# Patient Record
Sex: Male | Born: 1980
Health system: Southern US, Community
[De-identification: ages and names within clinical notes are randomized; demographics above are authoritative.]

## PROBLEM LIST (undated history)

## (undated) DIAGNOSIS — Z8781 Personal history of (healed) traumatic fracture: Secondary | ICD-10-CM

## (undated) HISTORY — DX: Personal history of (healed) traumatic fracture: Z87.81

---

## 2005-05-20 ENCOUNTER — Inpatient Hospital Stay (HOSPITAL_COMMUNITY): Admission: EM | Admit: 2005-05-20 | Discharge: 2005-05-22 | Payer: Self-pay | Admitting: Emergency Medicine

## 2005-05-24 HISTORY — PX: FRACTURE SURGERY: SHX138

## 2005-09-26 ENCOUNTER — Encounter: Admission: RE | Admit: 2005-09-26 | Discharge: 2005-09-26 | Payer: Self-pay | Admitting: Orthopedic Surgery

## 2005-09-29 ENCOUNTER — Ambulatory Visit (HOSPITAL_COMMUNITY): Admission: RE | Admit: 2005-09-29 | Discharge: 2005-09-30 | Payer: Self-pay | Admitting: Orthopedic Surgery

## 2006-12-01 IMAGING — CR DG TIBIA/FIBULA PORT 2V*L*
2 series · 2 of 2 positions shown · non-contrast
Comparison: 09/26/05.

CLINICAL DATA: Nonunion of left tibial fracture ? status post bone graft. 
 PORTABLE LEFT TIBIA/FIBULA ? 2 VIEW:

[view not recorded (1 of 2)]
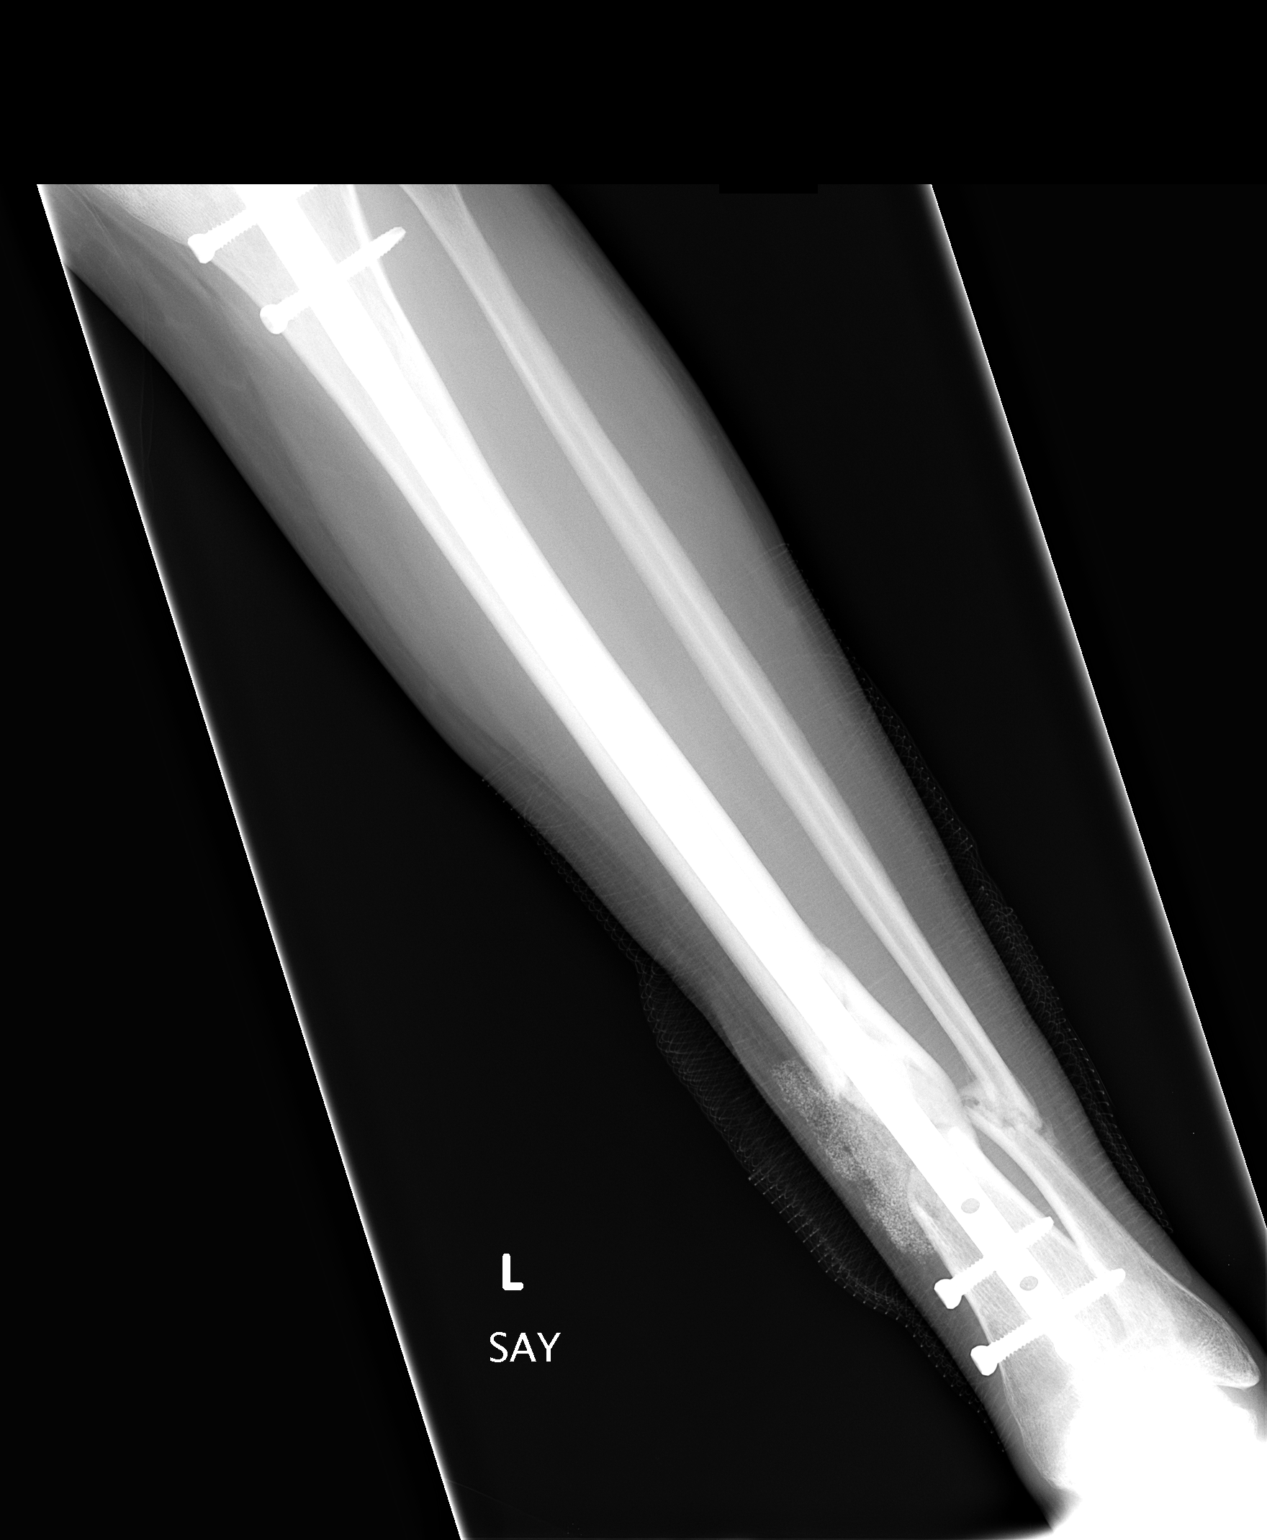

[view not recorded (2 of 2)]
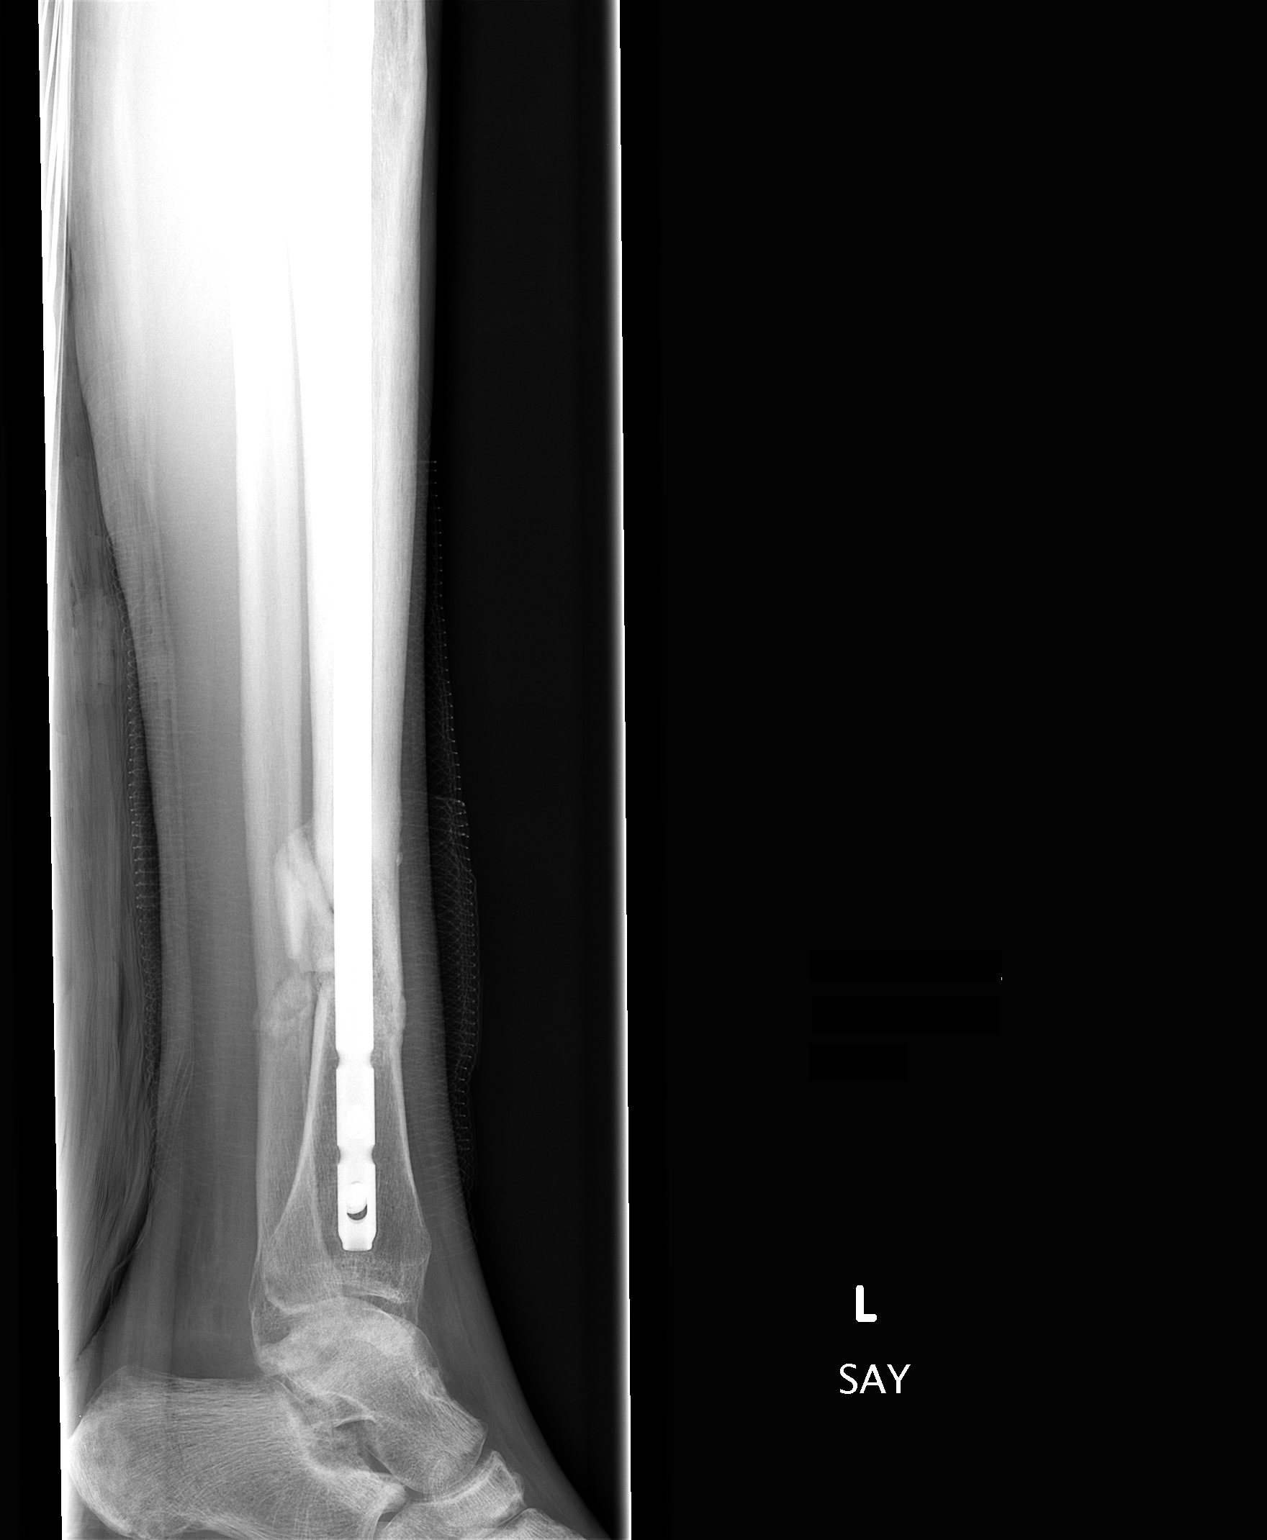

[2 of 2 positions shown; findings below may reference images not displayed]

FINDINGS: Two views with portable apparatus show placement of radiodense bone-like material along the medial aspect of the ununited tibial fracture.
IMPRESSION: Status post bone packing/grafting.

## 2011-06-07 ENCOUNTER — Ambulatory Visit (INDEPENDENT_AMBULATORY_CARE_PROVIDER_SITE_OTHER): Payer: Worker's Compensation | Admitting: Family Medicine

## 2011-06-07 ENCOUNTER — Encounter: Payer: Self-pay | Admitting: Family Medicine

## 2011-06-07 DIAGNOSIS — M75100 Unspecified rotator cuff tear or rupture of unspecified shoulder, not specified as traumatic: Secondary | ICD-10-CM

## 2011-06-07 DIAGNOSIS — M7520 Bicipital tendinitis, unspecified shoulder: Secondary | ICD-10-CM

## 2011-06-07 DIAGNOSIS — M7550 Bursitis of unspecified shoulder: Secondary | ICD-10-CM

## 2011-06-07 DIAGNOSIS — M67929 Unspecified disorder of synovium and tendon, unspecified upper arm: Secondary | ICD-10-CM

## 2011-06-07 NOTE — Progress Notes (Signed)
Dr. Kathrine Haddock has requested a consult for Right shoulder pain:  The patient noted above presents with shoulder pain that has been ongoing since DOI 05/24/2011. The patient describes grabbing a gear bag at work and flinging behind his R shoulder with acute onset of pain in the anterior shoulder and arm. He had some tingling in his arm for a few days, which has subsided. The patient denies neck pain. Denies dislocation, subluxation, separation of the shoulder. The patient does complain of pain in the overhead plane with mild painful arc of motion - worst with "reaching for back pocket" motion. Now on light duty with restriction of no overhead lifting.   He feels like he is improving.  Medications Tried: Tylenol, motrin Ice or Heat: minimally helpful Tried PT: No  Prior shoulder Injury: No Prior surgery: No Prior fracture: No  Patient Active Problem List  Diagnoses  . Subacromial bursitis  . Biceps tendinopathy   Past Medical History  Diagnosis Date  . History of fracture of lower leg    No past surgical history on file. History  Substance Use Topics  . Smoking status: Former Games developer  . Smokeless tobacco: Never Used  . Alcohol Use: Not on file   No family history on file. No Known Allergies No current outpatient prescriptions on file prior to visit.    REVIEW OF SYSTEMS  GEN: No fevers, chills. Nontoxic. Primarily MSK c/o today. MSK: Detailed in the HPI GI: tolerating PO intake without difficulty Neuro: above Otherwise, the pertinent positives and negatives are listed above and in the HPI, otherwise a full review of systems has been reviewed and is negative unless noted positive.    PHYSICAL EXAM  Blood pressure 146/92, pulse 88, height 6' (1.829 m), weight 192 lb (87.091 kg).  GEN: Well-developed,well-nourished,in no acute distress; alert,appropriate and cooperative throughout examination HEENT: Normocephalic and atraumatic without obvious abnormalities. Ears,  externally no deformities PULM: Breathing comfortably in no respiratory distress EXT: No clubbing, cyanosis, or edema PSYCH: Normally interactive. Cooperative during the interview. Pleasant. Friendly and conversant. Not anxious or depressed appearing. Normal, full affect.  Shoulder: R Inspection: No muscle wasting or winging Ecchymosis/edema: neg  AC joint, scapula, clavicle: mild TTP Cervical spine: NT, full ROM Spurling's: neg Abduction: full, 5/5 Flexion: full, 5/5 IR, full, lift-off: 5/5 ER at neutral: full, 5/5 AC crossover: pos Neer: neg Hawkins: mild Drop Test: neg Empty Can: neg Supraspinatus insertion: mild-mod T Bicipital groove: TTP Speed's: POS Yergason's: POS Sulcus sign: neg Scapular dyskinesis: none C5-T1 intact  Neuro: Sensation intact Grip 5/5   Diagnostic Ultrasound Evaluation [employer] E, MSK ultrasound, MSK probe Anatomy scanned: right shoulder Indication: Pain Findings: There is significant fluid surrounding the biceps tendon in the sheath. The biceps tendon has areas that appear hypoechoic in part and less congruous compared to typical. This may represent horizontal tearing or partial thickness tear. No tearing on subscap or supraspinatus. No subcoracoid impingement. Mild subacromial bursitis. No significant AC arthritis.   A/P: 1. Probable horizontal tear of biceps tendon with associated increased fluid in sheath: Improving clinically. 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  Biceps lowers, eccentrics Rotator cuff strengthening and scapular stabilization exercises were reviewed with the patient.  [facility] RTC and scapular stabilization program given to the patient. Retraining shoulder mechanics and function was emphasized to the patient with rehab done at least 5-6 days a week.  Recommend:  formal PT to assist with Biceps functional rehab, scapular stabilization and RTC strengthening.  2-3 times a week for 2-3  weeks  Recheck in 3 weeks  2. Subacromial bursitis: corresponds to area of pain on exam, correlates with US findings.  Cc: Dr. Kathrine Haddock

## 2011-06-08 NOTE — Progress Notes (Signed)
This visit note faxed to Grayland Ormond Hunt's office for referral to PT for patient.  Faxed to [phone].

## 2011-06-28 ENCOUNTER — Encounter: Payer: Self-pay | Admitting: Family Medicine

## 2011-06-28 ENCOUNTER — Ambulatory Visit (INDEPENDENT_AMBULATORY_CARE_PROVIDER_SITE_OTHER): Payer: Worker's Compensation | Admitting: Family Medicine

## 2011-06-28 DIAGNOSIS — M75100 Unspecified rotator cuff tear or rupture of unspecified shoulder, not specified as traumatic: Secondary | ICD-10-CM

## 2011-06-28 DIAGNOSIS — M7520 Bicipital tendinitis, unspecified shoulder: Secondary | ICD-10-CM

## 2011-06-28 DIAGNOSIS — M7550 Bursitis of unspecified shoulder: Secondary | ICD-10-CM

## 2011-06-28 DIAGNOSIS — M67929 Unspecified disorder of synovium and tendon, unspecified upper arm: Secondary | ICD-10-CM

## 2011-06-29 NOTE — Progress Notes (Signed)
Peter Fuller, a 30 y.o. male presents today in the office for the following:    F/u R shoulder pain, sub ac bursitis, biceps tendinopathy.  Has been very compliant with RTC str and scapular stabilization, has been on limitations at work, and is now about 90-95% better. No functional limitation  Prior OV: The patient noted above presents with shoulder pain that has been ongoing since DOI 05/24/2011. The patient describes grabbing a gear bag at work and flinging behind his R shoulder with acute onset of pain in the anterior shoulder and arm. He had some tingling in his arm for a few days, which has subsided. The patient denies neck pain. Denies dislocation, subluxation, separation of the shoulder. The patient does complain of pain in the overhead plane with mild painful arc of motion - worst with "reaching for back pocket" motion. Now on light duty with restriction of no overhead lifting.   He feels like he is improving.  Medications Tried: Tylenol, motrin Ice or Heat: minimally helpful Tried PT: No  Prior shoulder Injury: No Prior surgery: No Prior fracture: No  Patient Active Problem List  Diagnoses  . Subacromial bursitis  . Biceps tendinopathy   Past Medical History  Diagnosis Date  . History of fracture of lower leg    No past surgical history on file. History  Substance Use Topics  . Smoking status: Former Games developer  . Smokeless tobacco: Never Used  . Alcohol Use: Not on file   No family history on file. No Known Allergies No current outpatient prescriptions on file prior to visit.    REVIEW OF SYSTEMS  GEN: No fevers, chills. Nontoxic. Primarily MSK c/o today. MSK: Detailed in the HPI GI: tolerating PO intake without difficulty Neuro: above Otherwise, the pertinent positives and negatives are listed above and in the HPI, otherwise a full review of systems has been reviewed and is negative unless noted positive.    PHYSICAL EXAM  Blood pressure 135/89, pulse  66.  GEN: Well-developed,well-nourished,in no acute distress; alert,appropriate and cooperative throughout examination HEENT: Normocephalic and atraumatic without obvious abnormalities. Ears, externally no deformities PULM: Breathing comfortably in no respiratory distress EXT: No clubbing, cyanosis, or edema PSYCH: Normally interactive. Cooperative during the interview. Pleasant. Friendly and conversant. Not anxious or depressed appearing. Normal, full affect.  Shoulder: R Inspection: No muscle wasting or winging Ecchymosis/edema: neg  AC joint, scapula, clavicle: NT Cervical spine: NT, full ROM Spurling's: neg Abduction: full, 5/5 Flexion: full, 5/5 IR, full, lift-off: 5/5 ER at neutral: full, 5/5 AC crossover: NEG Neer: neg Hawkins: mild Drop Test: neg Empty Can: neg Supraspinatus insertion: NT Bicipital groove: NEG Speed's: NEG Yergason's: NEG Sulcus sign: neg Scapular dyskinesis: none C5-T1 intact  Neuro: Sensation intact Grip 5/5   1. Subacromial bursitis   2. Biceps tendinopathy     Plan: Dramatically improved. Clear to full duty, cont with maintenance HEP for bicep, RTC, scapula.  Cc: Dr. Kathrine Haddock

## 2014-01-28 ENCOUNTER — Ambulatory Visit: Payer: Worker's Compensation | Admitting: Family Medicine

## 2014-02-07 ENCOUNTER — Ambulatory Visit: Payer: Worker's Compensation | Admitting: Family Medicine

## 2014-02-21 ENCOUNTER — Encounter: Payer: Self-pay | Admitting: Family Medicine

## 2014-02-21 ENCOUNTER — Ambulatory Visit (INDEPENDENT_AMBULATORY_CARE_PROVIDER_SITE_OTHER): Payer: 59 | Admitting: Family Medicine

## 2014-02-21 VITALS — BP 130/80 | HR 67 | Temp 97.9°F | Ht 71.75 in | Wt 190.2 lb

## 2014-02-21 DIAGNOSIS — R5381 Other malaise: Secondary | ICD-10-CM | POA: Insufficient documentation

## 2014-02-21 DIAGNOSIS — N529 Male erectile dysfunction, unspecified: Secondary | ICD-10-CM

## 2014-02-21 DIAGNOSIS — R5383 Other fatigue: Principal | ICD-10-CM

## 2014-02-21 DIAGNOSIS — R6882 Decreased libido: Secondary | ICD-10-CM

## 2014-02-21 DIAGNOSIS — B009 Herpesviral infection, unspecified: Secondary | ICD-10-CM

## 2014-02-21 DIAGNOSIS — F520 Hypoactive sexual desire disorder: Secondary | ICD-10-CM

## 2014-02-21 MED ORDER — VALACYCLOVIR HCL 1 G PO TABS: 1000.0000 mg | ORAL_TABLET | Freq: Two times a day (BID) | ORAL | Status: DC

## 2014-02-21 NOTE — Progress Notes (Signed)
Subjective:    Patient ID: Peter Fuller, male    DOB: [birth year], 33 y.o.   MRN: [id number]  HPI 33 year old male presents to establish. Previously seen [facility]. Clovis RileyMitchell at [facility]. Last CPX last year. 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  He reports he has been having a lot of fatigue , ongoing for 2 years, gradually worsening. Has been shown to have low testosterone twice in past, in 2013. He has had decrease sexual functioning and now ED. Going to marriage counseling. Some issues with restlessness at night, no insomnia  He does snore occ, no apnea per wife. No depression, anxiety.  He has HSV 1 orally... Needs refill of valacyclovir.   He has been told PFT decreasing some in past. No SOB.  Exercsies  Diet:     Review of Systems  Constitutional: Positive for fatigue. Negative for fever and unexpected weight change.  HENT: Negative for congestion, ear pain, postnasal drip, rhinorrhea, sore throat and trouble swallowing.   Eyes: Negative for pain.  Respiratory: Negative for cough, shortness of breath and wheezing.   Cardiovascular: Negative for chest pain, palpitations and leg swelling.  Gastrointestinal: Negative for nausea, abdominal pain, diarrhea, constipation and blood in stool.  Genitourinary: Negative for dysuria, urgency, hematuria, discharge, penile swelling, scrotal swelling, difficulty urinating, penile pain and testicular pain.  Skin: Negative for rash.  Neurological: Negative for syncope, weakness, light-headedness, numbness and headaches.  Psychiatric/Behavioral: Negative for suicidal ideas, behavioral problems and dysphoric mood. The patient is not nervous/anxious.        Objective:   Physical Exam  Constitutional: He appears well-developed and well-nourished.  Non-toxic appearance. He does not appear ill. No distress.  HENT:  Head: Normocephalic and atraumatic.  Right Ear: Hearing, tympanic membrane, external ear and ear canal  normal.  Left Ear: Hearing, tympanic membrane, external ear and ear canal normal.  Nose: Nose normal.  Mouth/Throat: Uvula is midline, oropharynx is clear and moist and mucous membranes are normal.  Eyes: Conjunctivae, EOM and lids are normal. Pupils are equal, round, and reactive to light. Lids are everted and swept, no foreign bodies found.  Neck: Trachea normal, normal range of motion and phonation normal. Neck supple. Carotid bruit is not present. No mass and no thyromegaly present.  Cardiovascular: Normal rate, regular rhythm, S1 normal, S2 normal, intact distal pulses and normal pulses.  Exam reveals no gallop.   No murmur heard. Pulmonary/Chest: Breath sounds normal. He has no wheezes. He has no rhonchi. He has no rales.  Abdominal: Soft. Normal appearance and bowel sounds are normal. There is no hepatosplenomegaly. There is no tenderness. There is no rebound, no guarding and no CVA tenderness. No hernia. Hernia confirmed negative in the right inguinal area and confirmed negative in the left inguinal area.  Genitourinary: Testes normal and penis normal. Right testis shows no mass and no tenderness. Left testis shows no mass and no tenderness. No paraphimosis or penile tenderness.  Normal size testicles  Lymphadenopathy:    He has no cervical adenopathy.       Right: No inguinal adenopathy present.       Left: No inguinal adenopathy present.  Neurological: He is alert. He has normal strength and normal reflexes. No cranial nerve deficit or sensory deficit. Gait normal.  Skin: Skin is warm, dry and intact. No rash noted.  Psychiatric: He has a normal mood and affect. His speech is normal and behavior is normal. Judgment normal.  Assessment & Plan:

## 2014-02-21 NOTE — Addendum Note (Signed)
Addended by: Alvina ChouWALSH, TERRI J on: 02/21/2014 12:42 PM   Modules accepted: Orders

## 2014-02-21 NOTE — Patient Instructions (Addendum)
Return for early morning no fasting labs to evaluate fatigue. We will call you with the results.

## 2014-02-21 NOTE — Progress Notes (Signed)
Pre visit review using our clinic review tool, if applicable. No additional management support is needed unless otherwise documented below in the visit note. 

## 2014-02-24 ENCOUNTER — Other Ambulatory Visit (INDEPENDENT_AMBULATORY_CARE_PROVIDER_SITE_OTHER): Payer: Self-pay

## 2014-02-24 DIAGNOSIS — B009 Herpesviral infection, unspecified: Secondary | ICD-10-CM

## 2014-02-24 DIAGNOSIS — R6882 Decreased libido: Secondary | ICD-10-CM

## 2014-02-24 DIAGNOSIS — F520 Hypoactive sexual desire disorder: Secondary | ICD-10-CM

## 2014-02-24 DIAGNOSIS — R5383 Other fatigue: Principal | ICD-10-CM

## 2014-02-24 DIAGNOSIS — N529 Male erectile dysfunction, unspecified: Secondary | ICD-10-CM

## 2014-02-24 DIAGNOSIS — R5381 Other malaise: Secondary | ICD-10-CM

## 2014-02-24 LAB — CBC WITH DIFFERENTIAL/PLATELET
Basophils Absolute: 0 10*3/uL (ref 0.0–0.1)
Basophils Relative: 0.4 % (ref 0.0–3.0)
Eosinophils Absolute: 0.1 10*3/uL (ref 0.0–0.7)
Eosinophils Relative: 1.4 % (ref 0.0–5.0)
HCT: 44.7 % (ref 39.0–52.0)
Hemoglobin: 15 g/dL (ref 13.0–17.0)
Lymphocytes Relative: 27 % (ref 12.0–46.0)
Lymphs Abs: 1.7 10*3/uL (ref 0.7–4.0)
MCHC: 33.5 g/dL (ref 30.0–36.0)
MCV: 89.9 fl (ref 78.0–100.0)
Monocytes Absolute: 0.5 10*3/uL (ref 0.1–1.0)
Monocytes Relative: 8.8 % (ref 3.0–12.0)
Neutro Abs: 3.9 10*3/uL (ref 1.4–7.7)
Neutrophils Relative %: 62.4 % (ref 43.0–77.0)
Platelets: 229 10*3/uL (ref 150.0–400.0)
RBC: 4.98 Mil/uL (ref 4.22–5.81)
RDW: 13.7 % (ref 11.5–14.6)
WBC: 6.2 10*3/uL (ref 4.5–10.5)

## 2014-02-24 LAB — TESTOSTERONE: Testosterone: 307.18 ng/dL — ABNORMAL LOW (ref 350.00–890.00)

## 2014-02-24 LAB — TSH: TSH: 2.57 u[IU]/mL (ref 0.35–5.50)

## 2014-02-24 LAB — VITAMIN B12: Vitamin B-12: 340 pg/mL (ref 211–911)

## 2014-02-25 ENCOUNTER — Telehealth: Payer: Self-pay | Admitting: Family Medicine

## 2014-02-25 DIAGNOSIS — F520 Hypoactive sexual desire disorder: Secondary | ICD-10-CM

## 2014-02-25 DIAGNOSIS — R7989 Other specified abnormal findings of blood chemistry: Secondary | ICD-10-CM | POA: Insufficient documentation

## 2014-02-25 DIAGNOSIS — N529 Male erectile dysfunction, unspecified: Secondary | ICD-10-CM

## 2014-02-25 LAB — VITAMIN D 25 HYDROXY (VIT D DEFICIENCY, FRACTURES): Vit D, 25-Hydroxy: 31 ng/mL (ref 30–89)

## 2014-02-25 NOTE — Telephone Encounter (Signed)
Message copied by Excell SeltzerBEDSOLE, Jencarlos Nicolson E on Tue Feb 25, 2014  4:51 PM ------      Message from: Damita LackLORING, DONNA S      Created: Tue Feb 25, 2014  4:27 PM       Patient notified as instructed by telephone.  He would like to move forward with Urology Referral. ------

## 2014-03-05 ENCOUNTER — Encounter: Payer: Self-pay | Admitting: Family Medicine

## 2014-05-20 ENCOUNTER — Telehealth: Payer: Self-pay | Admitting: Family Medicine

## 2014-05-20 NOTE — Telephone Encounter (Signed)
Patient Information:  Caller Name: [place]  Phone: [phone]  Patient: W[place], [place] I  Gender: Male  DOB: May 01, 1981  Age: 33 Years  PCP: Kerby Nora (Family Practice)  Office Follow Up:  Does the office need to follow up with this patient?: Yes  Instructions For The Office: PLEASE REVIEW AND ADVISE  RN Note:  Pharmacy [facility] [phone].  Husband wanting to know should he be given Treatment for +culture results from wife. PLEASE REVIEW.  Symptoms  Reason For Call & Symptoms: Patient states that his wife has tested postive for  E.Coli. She was placed on Cipro.  She was seen by her OB/GYN for cyst on inner labia. The OB/GYN  ruputured the cyst and did a culture an it came back E.Coli.  Husband is concerned and should he be treated since they are sexually active.?  He is not experiencing any drainage , sores or infection.  Reviewed Health History In EMR: Yes  Reviewed Medications In EMR: Yes  Reviewed Allergies In EMR: Yes  Reviewed Surgeries / Procedures: Yes  Date of Onset of Symptoms: 05/20/2014  Guideline(s) Used:  No Protocol Available - Information Only  Disposition Per Guideline:   Discuss with PCP and Callback by Nurse Today  Reason For Disposition Reached:   Nursing judgment  Advice Given:  Call Back If:  New symptoms develop  You become worse.  Patient Will Follow Care Advice:  YES

## 2014-05-20 NOTE — Telephone Encounter (Signed)
[place] notified as instructed by telephone.

## 2014-05-20 NOTE — Telephone Encounter (Signed)
No, this is not an STD.

## 2014-05-30 ENCOUNTER — Other Ambulatory Visit: Payer: Self-pay | Admitting: *Deleted

## 2014-05-30 MED ORDER — VALACYCLOVIR HCL 1 G PO TABS: 1000.0000 mg | ORAL_TABLET | Freq: Two times a day (BID) | ORAL | Status: DC

## 2014-06-04 ENCOUNTER — Other Ambulatory Visit: Payer: Self-pay | Admitting: *Deleted

## 2014-06-04 MED ORDER — VALACYCLOVIR HCL 1 G PO TABS: 1000.0000 mg | ORAL_TABLET | Freq: Two times a day (BID) | ORAL | Status: DC

## 2014-06-04 NOTE — Telephone Encounter (Signed)
Received fax from [facility] that states per patient's request can we please have a new Rx with a larger quantity.   Refilled on 05/30/2014 for #6 tablets with 3 refills.  Please advise.

## 2014-09-26 ENCOUNTER — Other Ambulatory Visit: Payer: Self-pay

## 2015-01-19 ENCOUNTER — Other Ambulatory Visit: Payer: Self-pay | Admitting: Occupational Medicine

## 2015-01-19 ENCOUNTER — Ambulatory Visit: Payer: Self-pay

## 2015-01-19 DIAGNOSIS — R52 Pain, unspecified: Secondary | ICD-10-CM

## 2015-08-10 ENCOUNTER — Other Ambulatory Visit: Payer: Self-pay | Admitting: Family Medicine

## 2015-08-10 NOTE — Telephone Encounter (Signed)
Last office visit 02/21/2014.  Refill?

## 2015-08-10 NOTE — Telephone Encounter (Signed)
Overdue for CPX.. Call to schedule., Refill denied until appt set.

## 2015-08-11 NOTE — Telephone Encounter (Signed)
Left message asking pt to call office  °

## 2015-08-11 NOTE — Telephone Encounter (Signed)
Please call and schedule CPE with fasting labs prior for Dr. Bedsole.  

## 2015-08-17 ENCOUNTER — Telehealth: Payer: Self-pay | Admitting: Family Medicine

## 2015-08-17 DIAGNOSIS — R7989 Other specified abnormal findings of blood chemistry: Secondary | ICD-10-CM

## 2015-08-17 DIAGNOSIS — Z13220 Encounter for screening for lipoid disorders: Secondary | ICD-10-CM

## 2015-08-17 NOTE — Telephone Encounter (Signed)
-----   Message from Alvina Chou sent at 08/11/2015  2:58 PM EDT ----- Regarding: Lab orders for Tuesday, 9.6.16 Patient is scheduled for CPX labs, please order future labs, Thanks , Camelia Eng

## 2015-08-18 ENCOUNTER — Encounter: Payer: Self-pay | Admitting: Internal Medicine

## 2015-08-18 ENCOUNTER — Other Ambulatory Visit: Payer: Self-pay | Admitting: Internal Medicine

## 2015-08-18 ENCOUNTER — Ambulatory Visit (INDEPENDENT_AMBULATORY_CARE_PROVIDER_SITE_OTHER): Payer: 59 | Admitting: Internal Medicine

## 2015-08-18 ENCOUNTER — Other Ambulatory Visit (INDEPENDENT_AMBULATORY_CARE_PROVIDER_SITE_OTHER): Payer: 59

## 2015-08-18 VITALS — BP 128/86 | HR 71 | Temp 97.9°F | Wt 197.0 lb

## 2015-08-18 DIAGNOSIS — M109 Gout, unspecified: Secondary | ICD-10-CM

## 2015-08-18 DIAGNOSIS — E291 Testicular hypofunction: Secondary | ICD-10-CM | POA: Diagnosis not present

## 2015-08-18 DIAGNOSIS — M1000 Idiopathic gout, unspecified site: Secondary | ICD-10-CM | POA: Diagnosis not present

## 2015-08-18 DIAGNOSIS — Z13220 Encounter for screening for lipoid disorders: Secondary | ICD-10-CM

## 2015-08-18 DIAGNOSIS — R7989 Other specified abnormal findings of blood chemistry: Secondary | ICD-10-CM

## 2015-08-18 LAB — COMPREHENSIVE METABOLIC PANEL WITH GFR
ALT: 14 U/L (ref 0–53)
AST: 16 U/L (ref 0–37)
Albumin: 4.5 g/dL (ref 3.5–5.2)
Alkaline Phosphatase: 45 U/L (ref 39–117)
BUN: 11 mg/dL (ref 6–23)
CO2: 32 meq/L (ref 19–32)
Calcium: 9.3 mg/dL (ref 8.4–10.5)
Chloride: 103 meq/L (ref 96–112)
Creatinine, Ser: 0.94 mg/dL (ref 0.40–1.50)
GFR: 96.39 mL/min
Glucose, Bld: 91 mg/dL (ref 70–99)
Potassium: 4 meq/L (ref 3.5–5.1)
Sodium: 143 meq/L (ref 135–145)
Total Bilirubin: 0.6 mg/dL (ref 0.2–1.2)
Total Protein: 6.6 g/dL (ref 6.0–8.3)

## 2015-08-18 LAB — LIPID PANEL
Cholesterol: 165 mg/dL (ref 0–200)
HDL: 41.3 mg/dL (ref >39.00)
LDL Cholesterol: 94 mg/dL (ref 0–99)
NonHDL: 123.34
Total CHOL/HDL Ratio: 4
Triglycerides: 146 mg/dL (ref 0.0–149.0)
VLDL: 29.2 mg/dL (ref 0.0–40.0)

## 2015-08-18 LAB — URIC ACID: URIC ACID, SERUM: 6.5 mg/dL (ref 4.0–7.8)

## 2015-08-18 LAB — TESTOSTERONE: Testosterone: 647.5 ng/dL (ref 300.00–890.00)

## 2015-08-18 MED ORDER — INDOMETHACIN 50 MG PO CAPS: 50.0000 mg | ORAL_CAPSULE | Freq: Three times a day (TID) | ORAL | Status: DC | PRN

## 2015-08-18 NOTE — Progress Notes (Signed)
Subjective:    Patient ID: Peter Fuller, male    DOB: Nov 15, 1981, 34 y.o.   MRN: [id number]  HPI  Pt presents to the clinic today with c/o toe pain. He reports this started 2-3 days ago. The toe has been red, swollen and painful. He describes the pain as throbbing and burning. It was so bad, that he could not put a sock or shoe on it yesterday. He has been taking Ibuprofen with minimal relief. He has never had symptoms like this before. He denies any injury to the area. He has no personal or family history of gout. He has not eaten a lot of red meat, drank any alcohol or consumed high purine foods that he is aware of.  Review of Systems      Past Medical History  Diagnosis Date  . History of fracture of lower leg     Current Outpatient Prescriptions  Medication Sig Dispense Refill  . valACYclovir (VALTREX) 1000 MG tablet Take 1 tablet (1,000 mg total) by mouth 2 (two) times daily. 30 tablet 3   No current facility-administered medications for this visit.    No Known Allergies  Family History  Problem Relation Age of Onset  . Arthritis Mother   . Hyperlipidemia Mother   . Hypertension Mother   . Hyperlipidemia Father   . Hypertension Father   . Arthritis Maternal Grandmother   . Colon cancer Paternal Grandmother   . Arthritis Paternal Grandmother     Social History   Social History  . Marital Status: Single    Spouse Name: N/A  . Number of Children: N/A  . Years of Education: N/A   Occupational History  . Not on file.   Social History Main Topics  . Smoking status: Former Smoker    Quit date: 08/26/2004  . Smokeless tobacco: Never Used  . Alcohol Use: 0.0 - 2.5 oz/week    0-5 drink(s) per week     Comment: 0-5 drinks/week  . Drug Use: No  . Sexual Activity: Yes   Other Topics Concern  . Not on file   Social History Narrative   Exercise: 20-30 min 3-4 times a week.   Diet: healthy    Married, no children     Constitutional: Denies fever, malaise,  fatigue, headache or abrupt weight changes.  Respiratory: Denies difficulty breathing, shortness of breath, cough or sputum production.   Cardiovascular: Denies chest pain, chest tightness, palpitations or swelling in the hands or feet.  Musculoskeletal: Pt reports left toe pain and swelling. Denies decrease in range of motion, difficulty with gait, muscle pain.  Skin: Pt reports redness of left big toe. Denies rashes, lesions or ulcercations.   No other specific complaints in a complete review of systems (except as listed in HPI above).  Objective:   Physical Exam  BP 128/86 mmHg  Pulse 71  Temp(Src) 97.9 F (36.6 C) (Oral)  Wt 197 lb (89.359 kg)  SpO2 99% Wt Readings from Last 3 Encounters:  08/18/15 197 lb (89.359 kg)  02/21/14 190 lb 4 oz (86.297 kg)  06/07/11 192 lb (87.091 kg)    General: Appears his stated age, well developed, well nourished in NAD. Skin: Warm, dry and intact. Left big toe not red today. Cardiovascular: Normal rate and rhythm. S1,S2 noted.  No murmur, rubs or gallops noted.  Pulmonary/Chest: Normal effort and positive vesicular breath sounds. No respiratory distress. No wheezes, rales or ronchi noted.  Musculoskeletal: Normal flexion, extension of the left big toe.  Pain with palpation along the medial edge of the left great toe. No swelling noted today. No difficulty with gait.  Neurological: Alert and oriented. Sensation intact to BLE.    Assessment & Plan:   Possible gout of left great toe:  Advised him to stop Ibuprofen eRx for Indomethacin 50 mg TID prn for pain (no CMET to review but he reports he has no history of kidney disease) Diet information given for gout He will return in 2 weeks to check uric acid levels  RTC as needed or if symptoms persist or worsen

## 2015-08-18 NOTE — Patient Instructions (Signed)

## 2015-08-18 NOTE — Progress Notes (Signed)
Pre visit review using our clinic review tool, if applicable. No additional management support is needed unless otherwise documented below in the visit note. 

## 2015-08-18 NOTE — Addendum Note (Signed)
Addended by: Baldomero Lamy on: 08/18/2015 09:50 AM   Modules accepted: Orders

## 2015-08-21 ENCOUNTER — Encounter: Payer: 59 | Admitting: Family Medicine

## 2015-09-01 ENCOUNTER — Encounter: Payer: Self-pay | Admitting: Family Medicine

## 2015-09-01 ENCOUNTER — Ambulatory Visit (INDEPENDENT_AMBULATORY_CARE_PROVIDER_SITE_OTHER): Payer: 59 | Admitting: Family Medicine

## 2015-09-01 VITALS — BP 118/80 | HR 60 | Temp 97.8°F | Ht 72.0 in | Wt 193.5 lb

## 2015-09-01 DIAGNOSIS — Z23 Encounter for immunization: Secondary | ICD-10-CM

## 2015-09-01 DIAGNOSIS — E291 Testicular hypofunction: Secondary | ICD-10-CM

## 2015-09-01 DIAGNOSIS — R7989 Other specified abnormal findings of blood chemistry: Secondary | ICD-10-CM

## 2015-09-01 DIAGNOSIS — Z0000 Encounter for general adult medical examination without abnormal findings: Secondary | ICD-10-CM | POA: Diagnosis not present

## 2015-09-01 NOTE — Progress Notes (Signed)
Pre visit review using our clinic review tool, if applicable. No additional management support is needed unless otherwise documented below in the visit note. 

## 2015-09-01 NOTE — Progress Notes (Signed)
Subjective:    Patient ID: Peter Fuller, male    DOB: Mar 15, 1981, 34 y.o.   MRN: [id number]  HPI     34 year old male presents for wellness visit.  Recent flare of gout in left big toe. 08/18/2015.. First flare ever.  Uric acid: 6.5 Second flare after eating fish in last few days. Using indomethacin in last 48 hours.  Improving now.   Drinking lots of water.  Reviewed labs in detail with pt. Lab Results  Component Value Date   CHOL 165 08/18/2015   HDL 41.30 08/18/2015   LDLCALC 94 08/18/2015   TRIG 146.0 08/18/2015   CHOLHDL 4 08/18/2015    BP Readings from Last 3 Encounters:  09/01/15 118/80  08/18/15 128/86  02/21/14 130/80   Wt Readings from Last 3 Encounters:  09/01/15 193 lb 8 oz (87.771 kg)  08/18/15 197 lb (89.359 kg)  02/21/14 190 lb 4 oz (86.297 kg)   Body mass index is 26.24 kg/(m^2).  Diet: healthy eating Exercise: No recent  exercise given gout, usually running 2-3 miles 2-3 times a week.    Hypogonad.. Stable on testosterone injections.  Social History /Family History/Past Medical History reviewed and updated if needed.  Review of Systems  Constitutional: Negative for fever, fatigue and unexpected weight change.  HENT: Negative for congestion, ear pain, postnasal drip, rhinorrhea, sore throat and trouble swallowing.   Eyes: Negative for pain.  Respiratory: Negative for cough, shortness of breath and wheezing.   Cardiovascular: Negative for chest pain, palpitations and leg swelling.  Gastrointestinal: Negative for nausea, abdominal pain, diarrhea, constipation and blood in stool.  Genitourinary: Negative for dysuria, urgency, hematuria, discharge, penile swelling, scrotal swelling, difficulty urinating, penile pain and testicular pain.  Skin: Negative for rash.  Neurological: Negative for syncope, weakness, light-headedness, numbness and headaches.  Psychiatric/Behavioral: Negative for behavioral problems and dysphoric mood. The patient is not  nervous/anxious.        Objective:   Physical Exam  Constitutional: He appears well-developed and well-nourished.  Non-toxic appearance. He does not appear ill. No distress.  HENT:  Head: Normocephalic and atraumatic.  Right Ear: Hearing, tympanic membrane, external ear and ear canal normal.  Left Ear: Hearing, tympanic membrane, external ear and ear canal normal.  Nose: Nose normal.  Mouth/Throat: Uvula is midline, oropharynx is clear and moist and mucous membranes are normal.  Eyes: Conjunctivae, EOM and lids are normal. Pupils are equal, round, and reactive to light. Lids are everted and swept, no foreign bodies found.  Neck: Trachea normal, normal range of motion and phonation normal. Neck supple. Carotid bruit is not present. No thyroid mass and no thyromegaly present.  Cardiovascular: Normal rate, regular rhythm, S1 normal, S2 normal, intact distal pulses and normal pulses.  Exam reveals no gallop.   No murmur heard. Pulmonary/Chest: Breath sounds normal. He has no wheezes. He has no rhonchi. He has no rales.  Abdominal: Soft. Normal appearance and bowel sounds are normal. There is no hepatosplenomegaly. There is no tenderness. There is no rebound, no guarding and no CVA tenderness. No hernia. Hernia confirmed negative in the right inguinal area and confirmed negative in the left inguinal area.  Genitourinary: Prostate normal, testes normal and penis normal. Right testis shows no mass and no tenderness. Left testis shows no mass and no tenderness. No paraphimosis or penile tenderness.  Lymphadenopathy:    He has no cervical adenopathy.       Right: No inguinal adenopathy present.  Left: No inguinal adenopathy present.  Neurological: He is alert. He has normal strength and normal reflexes. No cranial nerve deficit or sensory deficit. Gait normal.  Skin: Skin is warm, dry and intact. No rash noted.  Psychiatric: He has a normal mood and affect. His speech is normal and behavior is  normal. Judgment normal.          Assessment & Plan:  The patient's preventative maintenance and recommended screening tests for an annual wellness exam were reviewed in full today. Brought up to date unless services declined.  Counselled on the importance of diet, exercise, and its role in overall health and mortality. The patient's FH and SH was reviewed, including their home life, tobacco status, and drug and alcohol status.   Vaccines: Flu : will get a work. Due for Tdap. STD testing: refused. Remote smoker: 2 ppd QUIT 2005. Former smoker

## 2015-09-01 NOTE — Addendum Note (Signed)
Addended by: Sydell Axon C on: 09/01/2015 10:13 AM   Modules accepted: Orders

## 2015-09-01 NOTE — Patient Instructions (Addendum)
Get back to regular exercise when able to.  Keep up the great work with healthy eating  and low cholesterol diet!  Low-Purine Diet Purines are compounds that affect the level of uric acid in your body. A low-purine diet is a diet that is low in purines. Eating a low-purine diet can prevent the level of uric acid in your body from getting too high and causing gout or kidney stones or both. WHAT DO I NEED TO KNOW ABOUT THIS DIET?  Choose low-purine foods. Examples of low-purine foods are listed in the next section.  Drink plenty of fluids, especially water. Fluids can help remove uric acid from your body. Try to drink 8-16 cups (1.9-3.8 L) a day.  Limit foods high in fat, especially saturated fat, as fat makes it harder for the body to get rid of uric acid. Foods high in saturated fat include pizza, cheese, ice cream, whole milk, fried foods, and gravies. Choose foods that are lower in fat and lean sources of protein. Use olive oil when cooking as it contains healthy fats that are not high in saturated fat.  Limit alcohol. Alcohol interferes with the elimination of uric acid from your body. If you are having a gout attack, avoid all alcohol.  Keep in mind that different people's bodies react differently to different foods. You will probably learn over time which foods do or do not affect you. If you discover that a food tends to cause your gout to flare up, avoid eating that food. You can more freely enjoy foods that do not cause problems. If you have any questions about a food item, talk to your dietitian or health care provider. WHICH FOODS ARE LOW, MODERATE, AND HIGH IN PURINES? The following is a list of foods that are low, moderate, and high in purines. You can eat any amount of the foods that are low in purines. You may be able to have small amounts of foods that are moderate in purines. Ask your health care provider how much of a food moderate in purines you can have. Avoid foods high in  purines. Grains  Foods low in purines: Enriched white bread, pasta, rice, cake, cornbread, popcorn.  Foods moderate in purines: Whole-grain breads and cereals, wheat germ, bran, oatmeal. Uncooked oatmeal. Dry wheat bran or wheat germ.  Foods high in purines: Pancakes, [place] toast, biscuits, muffins. Vegetables  Foods low in purines: All vegetables, except those that are moderate in purines.  Foods moderate in purines: Asparagus, cauliflower, spinach, mushrooms, green peas. Fruits  All fruits are low in purines. Meats and other Protein Foods  Foods low in purines: Eggs, nuts, peanut butter.  Foods moderate in purines: 80-90% lean beef, lamb, veal, pork, poultry, fish, eggs, peanut butter, nuts. Crab, lobster, oysters, and shrimp. Cooked dried beans, peas, and lentils.  Foods high in purines: Anchovies, sardines, herring, mussels, tuna, codfish, scallops, trout, and haddock. Tomasa Blase. Organ meats (such as liver or kidney). Tripe. Game meat. Goose. Sweetbreads. Dairy  All dairy foods are low in purines. Low-fat and fat-free dairy products are best because they are low in saturated fat. Beverages  Drinks low in purines: Water, carbonated beverages, tea, coffee, cocoa.  Drinks moderate in purines: Soft drinks and other drinks sweetened with high-fructose corn syrup. Juices. To find whether a food or drink is sweetened with high-fructose corn syrup, look at the ingredients list.  Drinks high in purines: Alcoholic beverages (such as beer). Condiments  Foods low in purines: Salt, herbs, olives, pickles,  relishes, vinegar.  Foods moderate in purines: Butter, margarine, oils, mayonnaise. Fats and Oils  Foods low in purines: All types, except gravies and sauces made with meat.  Foods high in purines: Gravies and sauces made with meat. Other Foods  Foods low in purines: Sugars, sweets, gelatin. Cake. Soups made without meat.  Foods moderate in purines: Meat-based or fish-based soups,  broths, or bouillons. Foods and drinks sweetened with high-fructose corn syrup.  Foods high in purines: High-fat desserts (such as ice cream, cookies, cakes, pies, doughnuts, and chocolate). Contact your dietitian for more information on foods that are not listed here. Document Released: 03/25/2011 Document Revised: 12/03/2013 Document Reviewed: 11/04/2013 [facility] Patient Information 2015 [place], [place]. This information is not intended to replace advice given to you by your health care provider. Make sure you discuss any questions you have with your health care provider.

## 2015-10-26 ENCOUNTER — Other Ambulatory Visit: Payer: Self-pay | Admitting: Family Medicine

## 2015-10-26 NOTE — Telephone Encounter (Signed)
Last office visit 09/01/2015.  Last refilled 06/04/2014 for #30 with 3 refills.  Ok to refill?

## 2015-12-04 ENCOUNTER — Ambulatory Visit (INDEPENDENT_AMBULATORY_CARE_PROVIDER_SITE_OTHER): Payer: 59 | Admitting: Family Medicine

## 2015-12-04 ENCOUNTER — Encounter: Payer: Self-pay | Admitting: Family Medicine

## 2015-12-04 VITALS — BP 148/102 | HR 96 | Temp 98.0°F | Ht 72.0 in | Wt 199.5 lb

## 2015-12-04 DIAGNOSIS — F419 Anxiety disorder, unspecified: Secondary | ICD-10-CM | POA: Insufficient documentation

## 2015-12-04 DIAGNOSIS — J069 Acute upper respiratory infection, unspecified: Secondary | ICD-10-CM | POA: Insufficient documentation

## 2015-12-04 MED ORDER — FLUTICASONE PROPIONATE 50 MCG/ACT NA SUSP: 2.0000 | Freq: Every day | NASAL | Status: DC

## 2015-12-04 NOTE — Patient Instructions (Signed)
Use the flonase as prescribed.  Call if you worsen or fail to improve.  Take care  Dr. Adriana Simas  Upper Respiratory Infection, Adult Most upper respiratory infections (URIs) are a viral infection of the air passages leading to the lungs. A URI affects the nose, throat, and upper air passages. The most common type of URI is nasopharyngitis and is typically referred to as "the common cold." URIs run their course and usually go away on their own. Most of the time, a URI does not require medical attention, but sometimes a bacterial infection in the upper airways can follow a viral infection. This is called a secondary infection. Sinus and middle ear infections are common types of secondary upper respiratory infections. Bacterial pneumonia can also complicate a URI. A URI can worsen asthma and chronic obstructive pulmonary disease (COPD). Sometimes, these complications can require emergency medical care and may be life threatening.  CAUSES Almost all URIs are caused by viruses. A virus is a type of germ and can spread from one person to another.  RISKS FACTORS You may be at risk for a URI if:   You smoke.   You have chronic heart or lung disease.  You have a weakened defense (immune) system.   You are very young or very old.   You have nasal allergies or asthma.  You work in crowded or poorly ventilated areas.  You work in health care facilities or schools. SIGNS AND SYMPTOMS  Symptoms typically develop 2-3 days after you come in contact with a cold virus. Most viral URIs last 7-10 days. However, viral URIs from the influenza virus (flu virus) can last 14-18 days and are typically more severe. Symptoms may include:   Runny or stuffy (congested) nose.   Sneezing.   Cough.   Sore throat.   Headache.   Fatigue.   Fever.   Loss of appetite.   Pain in your forehead, behind your eyes, and over your cheekbones (sinus pain).  Muscle aches.  DIAGNOSIS  Your health care  provider may diagnose a URI by:  Physical exam.  Tests to check that your symptoms are not due to another condition such as:  Strep throat.  Sinusitis.  Pneumonia.  Asthma. TREATMENT  A URI goes away on its own with time. It cannot be cured with medicines, but medicines may be prescribed or recommended to relieve symptoms. Medicines may help:  Reduce your fever.  Reduce your cough.  Relieve nasal congestion. HOME CARE INSTRUCTIONS   Take medicines only as directed by your health care provider.   Gargle warm saltwater or take cough drops to comfort your throat as directed by your health care provider.  Use a warm mist humidifier or inhale steam from a shower to increase air moisture. This may make it easier to breathe.  Drink enough fluid to keep your urine clear or pale yellow.   Eat soups and other clear broths and maintain good nutrition.   Rest as needed.   Return to work when your temperature has returned to normal or as your health care provider advises. You may need to stay home longer to avoid infecting others. You can also use a face mask and careful hand washing to prevent spread of the virus.  Increase the usage of your inhaler if you have asthma.   Do not use any tobacco products, including cigarettes, chewing tobacco, or electronic cigarettes. If you need help quitting, ask your health care provider. PREVENTION  The best way to protect  yourself from getting a cold is to practice good hygiene.   Avoid oral or hand contact with people with cold symptoms.   Wash your hands often if contact occurs.  There is no clear evidence that vitamin C, vitamin E, echinacea, or exercise reduces the chance of developing a cold. However, it is always recommended to get plenty of rest, exercise, and practice good nutrition.  SEEK MEDICAL CARE IF:   You are getting worse rather than better.   Your symptoms are not controlled by medicine.   You have chills.  You  have worsening shortness of breath.  You have brown or red mucus.  You have yellow or brown nasal discharge.  You have pain in your face, especially when you bend forward.  You have a fever.  You have swollen neck glands.  You have pain while swallowing.  You have white areas in the back of your throat. SEEK IMMEDIATE MEDICAL CARE IF:   You have severe or persistent:  Headache.  Ear pain.  Sinus pain.  Chest pain.  You have chronic lung disease and any of the following:  Wheezing.  Prolonged cough.  Coughing up blood.  A change in your usual mucus.  You have a stiff neck.  You have changes in your:  Vision.  Hearing.  Thinking.  Mood. MAKE SURE YOU:   Understand these instructions.  Will watch your condition.  Will get help right away if you are not doing well or get worse.   This information is not intended to replace advice given to you by your health care provider. Make sure you discuss any questions you have with your health care provider.   Document Released: 05/24/2001 Document Revised: 04/14/2015 Document Reviewed: 03/05/2014 Elsevier Interactive Patient Education [employer]2016 Elsevier Inc.

## 2015-12-04 NOTE — Assessment & Plan Note (Signed)
Secondary to recent Sudafed use. Advised him to discontinue. Supportive care.

## 2015-12-04 NOTE — Assessment & Plan Note (Addendum)
New problem. No indication for antibiotic at this time. Treating congestion with Flonase (OTC). Rx sent today. Also advised over-the-counter antihistamine.

## 2015-12-04 NOTE — Progress Notes (Signed)
Subjective:  Patient ID: Peter Fuller, male    DOB: Nov 29, 1981  Age: 34 y.o. MRN: [id number]  CC: Cold, congestion, anxiety/palpitations  HPI:  34 year old male presents to clinic today for an acute visit with the above complaints.  She states that he's had nasal congestion for the past 4 days. He reports that his nasal congestion is severe. He has made sleeping quite difficult. He reports some associated discolored mucus when he blows his nose. He also reports a slight cough. He has used Sudafed for his congestion. He states he had no improvement with this and asked has significant anxiety and palpitations after taking Sudafed. He reports associated fever although has not taken his temperature. No other complaints today. No known relieving factors.   Social Hx   Social History   Social History  . Marital Status: Single    Spouse Name: N/A  . Number of Children: N/A  . Years of Education: N/A   Social History Main Topics  . Smoking status: Former Smoker    Quit date: 08/26/2004  . Smokeless tobacco: Never Used  . Alcohol Use: 0.0 - 3.0 oz/week    0-5 Standard drinks or equivalent per week     Comment: 0-5 drinks/week  . Drug Use: No  . Sexual Activity: Yes   Other Topics Concern  . None   Social History Narrative   Exercise: 20-30 min 3-4 times a week.   Diet: healthy    Married, no children   Review of Systems  Constitutional:       Subjective fever.  HENT: Positive for congestion and sinus pressure.   Respiratory: Positive for cough.   Cardiovascular: Positive for palpitations.  Psychiatric/Behavioral: The patient is nervous/anxious.    Objective:  BP 148/102 mmHg  Pulse 96  Temp(Src) 98 F (36.7 C) (Oral)  Ht 6' (1.829 m)  Wt 199 lb 8 oz (90.493 kg)  BMI 27.05 kg/m2  SpO2 97%  BP/Weight 12/04/2015 09/01/2015 08/18/2015  Systolic BP 148 118 128  Diastolic BP 102 80 86  Wt. (Lbs) 199.5 193.5 197  BMI 27.05 26.24 26.92   Physical Exam  Constitutional: He  appears well-developed. No distress.  HENT:  Head: Normocephalic and atraumatic.  Right Ear: External ear normal.  Left Ear: External ear normal.  Mouth/Throat: Oropharynx is clear and moist.  Normal TM's bilaterally. Boggy turbinates.  Cardiovascular: Regular rhythm.  Tachycardia present.   Pulmonary/Chest: Effort normal and breath sounds normal. No respiratory distress. He has no wheezes. He has no rales.  Neurological: He is alert.  Psychiatric:  Anxious.  Vitals reviewed.  Lab Results  Component Value Date   WBC 6.2 02/24/2014   HGB 15.0 02/24/2014   HCT 44.7 02/24/2014   PLT 229.0 02/24/2014   GLUCOSE 91 08/18/2015   CHOL 165 08/18/2015   TRIG 146.0 08/18/2015   HDL 41.30 08/18/2015   LDLCALC 94 08/18/2015   ALT 14 08/18/2015   AST 16 08/18/2015   NA 143 08/18/2015   K 4.0 08/18/2015   CL 103 08/18/2015   CREATININE 0.94 08/18/2015   BUN 11 08/18/2015   CO2 32 08/18/2015   TSH 2.57 02/24/2014    Assessment & Plan:   Problem List Items Addressed This Visit    URI (upper respiratory infection) - Primary    New problem. No indication for antibiotic at this time. Treating congestion with Flonase (OTC). Rx sent today. Also advised over-the-counter antihistamine.        Anxiety  Secondary to recent Sudafed use. Advised him to discontinue. Supportive care.         Meds ordered this encounter  Medications  . fluticasone (FLONASE) 50 MCG/ACT nasal spray    Sig: Place 2 sprays into both nostrils daily.    Dispense:  16 g    Refill:  6    Follow-up: PRN  Everlene OtherJayce Dawnn Nam DO [facility] [place]

## 2015-12-04 NOTE — Progress Notes (Signed)
Pre visit review using our clinic review tool, if applicable. No additional management support is needed unless otherwise documented below in the visit note. 

## 2016-10-26 ENCOUNTER — Emergency Department (HOSPITAL_COMMUNITY): Admission: EM | Admit: 2016-10-26 | Discharge: 2016-10-26 | Discharge status: Absent without leave | Payer: 59

## 2016-12-16 ENCOUNTER — Ambulatory Visit: Payer: Self-pay | Admitting: Family Medicine

## 2017-11-13 ENCOUNTER — Ambulatory Visit: Payer: Self-pay

## 2017-11-13 NOTE — Telephone Encounter (Signed)
Pt. called to report facial injury approx. 3:00 PM today.  Stated he was checking a swimming pool pressure tank, and the cover rapidly popped-off, and hit him, just above the lip.  Estimated the cut above the lip to be about 3/8 " in length x 1/8" open.  Stated the area of injury is "slightly depressed".  Reported there was bleeding initially, but the bleeding has subsided.  Stated he has no real pain, but soreness of inside lip from the impact.  Unsure about last Tetanus shot.  Thinks it may have been one year ago, but is not sure.  Care advice per protocol.  [facility]  Advised since the Triage nurse is not sure if it will require sutures, he should consider going to UC.  Pt. Stated he will consider it, and will cancel tomorrow's appt. If he decided to go to UC.    Reason for Disposition . [1] Last tetanus shot > 5 years ago AND [2] DIRTY cut or scrape  Answer Assessment - Initial Assessment Questions 1. MECHANISM: "How did the injury happen?"     Pressurized cover from pool pump cut facial area below nose Hit by 2. ONSET: "When did the injury happen?" (Minutes or hours ago)     3:00 PM 3. LOCATION: "What part of the face is injured?"     Just above lip  4. APPEARANCE of INJURY: "What does the face look like?"     3/8 " long x 1/8 wide 5. BLEEDING: "Is it bleeding now?" If so, ask, "Is it difficult to stop?"     Bleeding stopped; it was minor per patient   6. PAIN: "Is there pain?" If so, ask: "How bad is the pain?"  (e.g., Scale 1-10; or mild, moderate, severe)     No discomfort 7. SIZE: For cuts, bruises, or swelling, ask: "How large is it?" (e.g., inches or centimeters)     See # 4 8. TETANUS: For any breaks in the skin, ask: "When was the last tetanus booster?"     Can't remember when this was; possibly one year. 9. OTHER SYMPTOMS: "Do you have any other symptoms?" (e.g., neck pain, headache, loss of consciousness)     No other symptoms 10. PREGNANCY:  "Is there any chance you are pregnant?" "When was your last menstrual period?"      n/a  Protocols used: FACE INJURY-A-AH

## 2017-11-14 ENCOUNTER — Ambulatory Visit: Payer: Self-pay | Admitting: Internal Medicine

## 2018-05-09 ENCOUNTER — Ambulatory Visit: Payer: Self-pay | Admitting: *Deleted

## 2018-05-09 ENCOUNTER — Ambulatory Visit: Payer: Self-pay | Admitting: Family Medicine

## 2018-05-09 ENCOUNTER — Ambulatory Visit: Payer: 59 | Admitting: Family Medicine

## 2018-05-09 ENCOUNTER — Encounter: Payer: Self-pay | Admitting: Family Medicine

## 2018-05-09 VITALS — BP 120/88 | HR 84 | Temp 98.2°F | Ht 72.0 in | Wt 209.0 lb

## 2018-05-09 DIAGNOSIS — R1010 Upper abdominal pain, unspecified: Secondary | ICD-10-CM

## 2018-05-09 DIAGNOSIS — M109 Gout, unspecified: Secondary | ICD-10-CM | POA: Insufficient documentation

## 2018-05-09 LAB — COMPREHENSIVE METABOLIC PANEL WITH GFR
ALT: 26 U/L (ref 0–53)
AST: 20 U/L (ref 0–37)
Albumin: 4.7 g/dL (ref 3.5–5.2)
Alkaline Phosphatase: 53 U/L (ref 39–117)
BUN: 15 mg/dL (ref 6–23)
CO2: 30 meq/L (ref 19–32)
Calcium: 9.8 mg/dL (ref 8.4–10.5)
Chloride: 102 meq/L (ref 96–112)
Creatinine, Ser: 1.06 mg/dL (ref 0.40–1.50)
GFR: 82.69 mL/min
Glucose, Bld: 93 mg/dL (ref 70–99)
Potassium: 4 meq/L (ref 3.5–5.1)
Sodium: 139 meq/L (ref 135–145)
Total Bilirubin: 0.9 mg/dL (ref 0.2–1.2)
Total Protein: 7.7 g/dL (ref 6.0–8.3)

## 2018-05-09 LAB — CBC WITH DIFFERENTIAL/PLATELET
Basophils Absolute: 0 K/uL (ref 0.0–0.1)
Basophils Relative: 0.2 % (ref 0.0–3.0)
Eosinophils Absolute: 0.2 K/uL (ref 0.0–0.7)
Eosinophils Relative: 2.7 % (ref 0.0–5.0)
HCT: 47.6 % (ref 39.0–52.0)
Hemoglobin: 16.3 g/dL (ref 13.0–17.0)
Lymphocytes Relative: 22.2 % (ref 12.0–46.0)
Lymphs Abs: 1.7 K/uL (ref 0.7–4.0)
MCHC: 34.2 g/dL (ref 30.0–36.0)
MCV: 88.5 fl (ref 78.0–100.0)
Monocytes Absolute: 0.6 K/uL (ref 0.1–1.0)
Monocytes Relative: 7.7 % (ref 3.0–12.0)
Neutro Abs: 5.1 K/uL (ref 1.4–7.7)
Neutrophils Relative %: 67.2 % (ref 43.0–77.0)
Platelets: 251 K/uL (ref 150.0–400.0)
RBC: 5.38 Mil/uL (ref 4.22–5.81)
RDW: 13.9 % (ref 11.5–15.5)
WBC: 7.6 K/uL (ref 4.0–10.5)

## 2018-05-09 LAB — GAMMA GT: GGT: 27 U/L (ref 7–51)

## 2018-05-09 LAB — LIPASE: LIPASE: 38 U/L (ref 11.0–59.0)

## 2018-05-09 NOTE — Progress Notes (Signed)
BP 120/88 (BP Location: Left Arm, Patient Position: Sitting, Cuff Size: Normal)   Pulse 84   Temp 98.2 F (36.8 C) (Oral)   Ht 6' (1.829 m)   Wt 209 lb (94.8 kg)   SpO2 95%   BMI 28.35 kg/m    CC: R abdominal pain Subjective:    Patient ID: Peter Fuller, male    DOB: 09/23/1981, 37 y.o.   MRN: [id number]  HPI: Peter Fuller is a 37 y.o. male presenting on 05/09/2018 for Flank Pain (Right side pain since 05/07/18. Has had some abd pain, bloating and nausea. Located in RUQ. Pain is 2-3. Stools are yellow/tan. Loose about 1 hr ago. Nothing to eat since 05/07/18. )   Increased belching over last 6 months. 2-3d h/o central abdominal pain associated with abd discomfort, bloating, nausea (started Sunday night). Pain described as dull ache. Not crescendo-decrescendo. Yesterday morning pain seemed to concentrate at RUQ with radiation to epigastric region, LUQ, 6/10 pain. This am felt better, but now pain has recurred. Stools have turned yellow/tan. This morning had loose diarrheal stool, otherwise normal.   Drinking fluids. No appetite, hasn't eaten anything over last 2 days.  Out of work yesterday.  No recent diet changes.  No h/o kidney stones. H/o gout, no recent flares.  Denies fevers, vomiting, blood in stool, dysuria, hematuria, urgency. No h/o kidney stones. No GERD symptoms, heartburn, indigestion. No flank pain. No boring pain to back  Off testosterone for last 6 months ([place]).   Relevant past medical, surgical, family and social history reviewed and updated as indicated. Interim medical history since our last visit reviewed. Allergies and medications reviewed and updated. Outpatient Medications Prior to Visit  Medication Sig Dispense Refill  . fluticasone (FLONASE) 50 MCG/ACT nasal spray Place 2 sprays into both nostrils daily. 16 g 6  . indomethacin (INDOCIN) 50 MG capsule Take 1 capsule (50 mg total) by mouth 3 (three) times daily as needed. 30 capsule 0  . Multiple Vitamin  (MULTIVITAMIN) tablet Take 1 tablet by mouth daily.    . valACYclovir (VALTREX) 1000 MG tablet TAKE 1 TABLET BY MOUTH TWICE A DAY 30 tablet 1  . testosterone cypionate (DEPOTESTOTERONE CYPIONATE) 100 MG/ML injection Inject 200 mg into the muscle every 14 (fourteen) days. For IM use only     No facility-administered medications prior to visit.      Per HPI unless specifically indicated in ROS section below Review of Systems     Objective:    BP 120/88 (BP Location: Left Arm, Patient Position: Sitting, Cuff Size: Normal)   Pulse 84   Temp 98.2 F (36.8 C) (Oral)   Ht 6' (1.829 m)   Wt 209 lb (94.8 kg)   SpO2 95%   BMI 28.35 kg/m   Wt Readings from Last 3 Encounters:  05/09/18 209 lb (94.8 kg)  12/04/15 199 lb 8 oz (90.5 kg)  09/01/15 193 lb 8 oz (87.8 kg)    Physical Exam  Constitutional: He appears well-developed and well-nourished. No distress.  HENT:  Mouth/Throat: Oropharynx is clear and moist. No oropharyngeal exudate.  Neck: No thyromegaly present.  Cardiovascular: Normal rate, regular rhythm and normal heart sounds.  No murmur heard. Pulmonary/Chest: Effort normal and breath sounds normal. No respiratory distress. He has no wheezes. He has no rales.  Abdominal: Soft. Bowel sounds are normal. He exhibits no distension and no mass. There is no hepatosplenomegaly. There is tenderness in the right upper quadrant, right lower quadrant, epigastric area, periumbilical area,  left upper quadrant and left lower quadrant. There is no rigidity, no rebound, no guarding, no CVA tenderness and negative Murphy's sign. No hernia.  General abdominal discomfort localized more to epigastrium and RUQ without guarding  Musculoskeletal: He exhibits no edema.  Lymphadenopathy:    He has no cervical adenopathy.  Skin: Skin is warm and dry. No rash noted. No pallor.  No jaundice  Nursing note and vitals reviewed.     Assessment & Plan:   Problem List Items Addressed This Visit    Pain of  upper abdomen - Primary    Suspicious for gallbladder disease. Not consistent with appendicitis, diverticulitis, cholecystitis, or GERD. Reviewed with patient. rec clear liquid diet for next few days then advance as tolerated. Check labs and abd [place] today. Update with results. Red flags to seek urgent care reviewed. Pt agrees with plan.       Relevant Orders   Comprehensive metabolic panel   CBC with Differential/Platelet   Lipase   US Abdomen Complete   Gamma GT       No orders of the defined types were placed in this encounter.  Orders Placed This Encounter  Procedures  . US Abdomen Complete    Standing Status:   Future    Standing Expiration Date:   07/10/2019    Order Specific Question:   Reason for Exam (SYMPTOM  OR DIAGNOSIS REQUIRED)    Answer:   upper abd pain worse at RUQ    Order Specific Question:   Preferred imaging location?    Answer:   [facility]    Order Specific Question:   Call Results- Best Contact Number?    Answer:   [phone]  . Comprehensive metabolic panel  . CBC with Differential/Platelet  . Lipase  . Gamma GT    Follow up plan: Return if symptoms worsen or fail to improve.  Eustaquio Boyden, MD

## 2018-05-09 NOTE — Assessment & Plan Note (Addendum)
Suspicious for gallbladder disease. Not consistent with appendicitis, diverticulitis, cholecystitis, or GERD. Reviewed with patient. rec clear liquid diet for next few days then advance as tolerated. Check labs and abd [place] today. Update with results. Red flags to seek urgent care reviewed. Pt agrees with plan.

## 2018-05-09 NOTE — Telephone Encounter (Signed)
Pt has appt 05/09/18 at 4 PM with Dr Milinda Antis.

## 2018-05-09 NOTE — Patient Instructions (Addendum)
I am suspicious for gallbladder disease ?gallstone. Labs today See our referral coordinators to schedule abdominal ultrasound. Seek urgent care if fever >101, yellowing of the skin, worsening abdominal pain with vomiting.  Clear liquid diet for now. We will be in touch with results.

## 2018-05-09 NOTE — Telephone Encounter (Signed)
Maryann RN at [facility] skyped concerned pt should be seen earlier due to possible testing needed. Pt scheduled with DR G 05/09/18 at 11:30.

## 2018-05-09 NOTE — Telephone Encounter (Signed)
Pt reports abdominal pain, onset Monday. States localized at RUQ Monday, abdomen bloated at that time, nausea, no vomiting, "Felt gassy." States pain intermittent, mild Tuesday and nausea had "mostly" resolved. States this am abdominal pain "More diffuse" entire abdomen, 3-4/10; mild nausea. LBM this AM, "Little loose, tan colored." Denies fever, states is keeping hydrated. Appt made for today with Dr. Milinda Antis. Care advise given per protocol. Reason for Disposition . [1] MODERATE pain (e.g., interferes with normal activities) AND [2] pain comes and goes (cramps) AND [3] present > 24 hours  (Exception: pain with Vomiting or Diarrhea - see that Guideline)  Answer Assessment - Initial Assessment Questions 1. LOCATION: "Where does it hurt?"      Monday upper right , would shoot across upper left quadrant 2. RADIATION: "Does the pain shoot anywhere else?" (e.g., chest, back)     Not presently 3. ONSET: "When did the pain begin?" (Minutes, hours or days ago)      Monday 4. SUDDEN: "Gradual or sudden onset?"     Sudden 5. PATTERN "Does the pain come and go, or is it constant?"    - If constant: "Is it getting better, staying the same, or worsening?"      (Note: Constant means the pain never goes away completely; most serious pain is constant and it progresses)     - If intermittent: "How long does it last?" "Do you have pain now?"     (Note: Intermittent means the pain goes away completely between bouts)     Constant dull 6. SEVERITY: "How bad is the pain?"  (e.g., Scale 1-10; mild, moderate, or severe)    - MILD (1-3): doesn't interfere with normal activities, abdomen soft and not tender to touch     - MODERATE (4-7): interferes with normal activities or awakens from sleep, tender to touch     - SEVERE (8-10): excruciating pain, doubled over, unable to do any normal activities       Mild-Moderate 7. RECURRENT SYMPTOM: "Have you ever had this type of abdominal pain before?" If so, ask: "When was the  last time?" and "What happened that time?"      No 8. CAUSE: "What do you think is causing the abdominal pain?"     Gas or GB 9. RELIEVING/AGGRAVATING FACTORS: "What makes it better or worse?" (e.g., movement, antacids, bowel movement)     Took Pepto Bismol, ineffective 10. OTHER SYMPTOMS: "Has there been any vomiting, diarrhea, constipation, or urine problems?"       Nausea, bloating initially, not now. LBM loose, tan in color, 05/09/18  Protocols used: ABDOMINAL PAIN - MALE-A-AH

## 2018-05-10 ENCOUNTER — Telehealth: Payer: Self-pay

## 2018-05-10 ENCOUNTER — Ambulatory Visit
Admission: RE | Admit: 2018-05-10 | Discharge: 2018-05-10 | Disposition: A | Discharge status: Home / Self Care | Payer: 59 | Source: Ambulatory Visit | Attending: Family Medicine | Admitting: Family Medicine

## 2018-05-10 DIAGNOSIS — R1010 Upper abdominal pain, unspecified: Secondary | ICD-10-CM | POA: Diagnosis not present

## 2018-05-10 DIAGNOSIS — R932 Abnormal findings on diagnostic imaging of liver and biliary tract: Secondary | ICD-10-CM | POA: Insufficient documentation

## 2018-05-10 DIAGNOSIS — K7689 Other specified diseases of liver: Secondary | ICD-10-CM | POA: Diagnosis not present

## 2018-05-10 NOTE — Telephone Encounter (Signed)
plz notify patient - ultrasound returned overall reassuringly ok - no gallstones. Showed some fatty liver (where fat cells appear in the liver instead of normal liver cells) which we can just watch for now, and shouldn't cause his symptoms.  Recommend trial OTC gas-X for bloating symptoms, try prilosec OTC  daily for next 1 week for possible indigestion contribution.  Let us know if not improving with this treatment to proceed with either further gallbladder imaging (HIDA scan) or GI referral.

## 2018-05-10 NOTE — Telephone Encounter (Addendum)
Kim with [facility] [place] called report complete abd; report is in epic and copy of report being given to DR G now; pt waiting. Dr Reece Agar said to tell pt he can go home and someone will contact him by phone. Tell pt overall [place] was OK and showed no gallstones. Kim voiced understanding and she will let pt know.

## 2018-05-10 NOTE — Telephone Encounter (Signed)
Spoke with pt relaying results and message per Dr. G.  Pt verbalizes understanding. 

## 2018-12-13 ENCOUNTER — Other Ambulatory Visit: Payer: Self-pay | Admitting: Family Medicine

## 2018-12-13 DIAGNOSIS — M109 Gout, unspecified: Secondary | ICD-10-CM

## 2018-12-13 NOTE — Telephone Encounter (Signed)
Last office visit 05/09/2018 with Dr. Reece Agar for Upper Abd Pain.  Last refilled 08/18/2015 for #30 with no refills.  No future appointments.  Last seen by PCP 09/01/2015.

## 2018-12-13 NOTE — Telephone Encounter (Signed)
Pt need prescription for indomethacin 50 mg  [facility] to [facility] [facility]

## 2018-12-17 ENCOUNTER — Encounter: Payer: Self-pay | Admitting: Family Medicine

## 2018-12-17 ENCOUNTER — Ambulatory Visit: Payer: 59 | Admitting: Family Medicine

## 2018-12-17 VITALS — BP 138/86 | HR 86 | Temp 98.4°F | Ht 72.0 in | Wt 221.0 lb

## 2018-12-17 DIAGNOSIS — B009 Herpesviral infection, unspecified: Secondary | ICD-10-CM

## 2018-12-17 DIAGNOSIS — M109 Gout, unspecified: Secondary | ICD-10-CM | POA: Diagnosis not present

## 2018-12-17 MED ORDER — VALACYCLOVIR HCL 1 G PO TABS: 1000.0000 mg | ORAL_TABLET | Freq: Two times a day (BID) | ORAL | 1 refills | Status: DC

## 2018-12-17 MED ORDER — INDOMETHACIN 50 MG PO CAPS: 50.0000 mg | ORAL_CAPSULE | Freq: Three times a day (TID) | ORAL | 0 refills | Status: DC | PRN

## 2018-12-17 NOTE — Patient Instructions (Signed)
Good to see you today, I have sent in your prescriptions to your pharmacy  Follow up with Dr. Ermalene Searing for annual exam, send your [facility] labs so they can be put in your electronic medical record   Gout  Gout is a condition that causes painful swelling of the joints. Gout is a type of inflammation of the joints (arthritis). This condition is caused by having too much uric acid in the body. Uric acid is a chemical that forms when the body breaks down substances called purines. Purines are important for building body proteins. When the body has too much uric acid, sharp crystals can form and build up inside the joints. This causes pain and swelling. Gout attacks can happen quickly and may be very painful (acute gout). Over time, the attacks can affect more joints and become more frequent (chronic gout). Gout can also cause uric acid to build up under the skin and inside the kidneys. What are the causes? This condition is caused by too much uric acid in your blood. This can happen because:  Your kidneys do not remove enough uric acid from your blood. This is the most common cause.  Your body makes too much uric acid. This can happen with some cancers and cancer treatments. It can also occur if your body is breaking down too many red blood cells (hemolytic anemia).  You eat too many foods that are high in purines. These foods include organ meats and some seafood. Alcohol, especially beer, is also high in purines. A gout attack may be triggered by trauma or stress. What increases the risk? You are more likely to develop this condition if you:  Have a family history of gout.  Are male and middle-aged.  Are male and have gone through menopause.  Are obese.  Frequently drink alcohol, especially beer.  Are dehydrated.  Lose weight too quickly.  Have an organ transplant.  Have lead poisoning.  Take certain medicines, including aspirin, cyclosporine, diuretics,  levodopa, and niacin.  Have kidney disease.  Have a skin condition called psoriasis. What are the signs or symptoms? An attack of acute gout happens quickly. It usually occurs in just one joint. The most common place is the big toe. Attacks often start at night. Other joints that may be affected include joints of the feet, ankle, knee, fingers, wrist, or elbow. Symptoms of this condition may include:  Severe pain.  Warmth.  Swelling.  Stiffness.  Tenderness. The affected joint may be very painful to touch.  Shiny, red, or purple skin.  Chills and fever. Chronic gout may cause symptoms more frequently. More joints may be involved. You may also have white or yellow lumps (tophi) on your hands or feet or in other areas near your joints. How is this diagnosed? This condition is diagnosed based on your symptoms, medical history, and physical exam. You may have tests, such as:  Blood tests to measure uric acid levels.  Removal of joint fluid with a thin needle (aspiration) to look for uric acid crystals.  X-rays to look for joint damage. How is this treated? Treatment for this condition has two phases: treating an acute attack and preventing future attacks. Acute gout treatment may include medicines to reduce pain and swelling, including:  NSAIDs.  Steroids. These are strong anti-inflammatory medicines that can be taken by mouth (orally) or injected into a joint.  Colchicine. This medicine relieves pain and swelling when it is taken soon after an attack. It can  be given by mouth or through an IV. Preventive treatment may include:  Daily use of smaller doses of NSAIDs or colchicine.  Use of a medicine that reduces uric acid levels in your blood.  Changes to your diet. You may need to see a dietitian about what to eat and drink to prevent gout. Follow these instructions at home: During a gout attack   If directed, put ice on the affected area: ? Put ice in a plastic  bag. ? Place a towel between your skin and the bag. ? Leave the ice on for 20 minutes, 2-3 times a day.  Raise (elevate) the affected joint above the level of your heart as often as possible.  Rest the joint as much as possible. If the affected joint is in your leg, you may be given crutches to use.  Follow instructions from your health care provider about eating or drinking restrictions. Avoiding future gout attacks  Follow a low-purine diet as told by your dietitian or health care provider. Avoid foods and drinks that are high in purines, including liver, kidney, anchovies, asparagus, herring, mushrooms, mussels, and beer.  Maintain a healthy weight or lose weight if you are overweight. If you want to lose weight, talk with your health care provider. It is important that you do not lose weight too quickly.  Start or maintain an exercise program as told by your health care provider. Eating and drinking  Drink enough fluids to keep your urine pale yellow.  If you drink alcohol: ? Limit how much you use to:  0-1 drink a day for women.  0-2 drinks a day for men. ? Be aware of how much alcohol is in your drink. In the [place] one drink equals one 12 oz bottle of beer (355 mL) one 5 oz glass of wine (148 mL), or one 1 oz glass of hard liquor (44 mL). General instructions  Take over-the-counter and prescription medicines only as told by your health care provider.  Do not drive or use heavy machinery while taking prescription pain medicine.  Return to your normal activities as told by your health care provider. Ask your health care provider what activities are safe for you.  Keep all follow-up visits as told by your health care provider. This is important. Contact a health care provider if you have:  Another gout attack.  Continuing symptoms of a gout attack after 10 days of treatment.  Side effects from your medicines.  Chills or a fever.  Burning pain when you  urinate.  Pain in your lower back or belly. Get help right away if you:  Have severe or uncontrolled pain.  Cannot urinate. Summary  Gout is painful swelling of the joints caused by inflammation.  The most common site of pain is the big toe, but it can affect other joints in the body.  Medicines and dietary changes can help to prevent and treat gout attacks. This information is not intended to replace advice given to you by your health care provider. Make sure you discuss any questions you have with your health care provider. Document Released: 11/25/2000 Document Revised: 06/20/2018 Document Reviewed: 06/20/2018 Elsevier Interactive Patient Education  2019 [employer].

## 2018-12-17 NOTE — Progress Notes (Signed)
Subjective:    Patient ID: Peter Fuller, male    DOB: Sep 15, 1981, 38 y.o.   MRN: [id number]  HPI This is a 38 yo male who presents today with a gout flare of right big toe. Had leftover indomethacin and has taken with some relief. Previous flare several years ago. Thinks seafood is a trigger. He has not seen PCP in several years. Was seen 5/19 for acute visit with Dr. Sharen Hones and had labs done at that time. He is a IT sales professional and has annual CPE with labs through [employer].  Requests refill of valacyclovir for oral herpes. Gets 1-2 break outs annually.   Past Medical History:  Diagnosis Date  . History of fracture of lower leg    Past Surgical History:  Procedure Laterality Date  . FRACTURE SURGERY  05/24/2005   tibial fracture   Family History  Problem Relation Age of Onset  . Arthritis Mother   . Hyperlipidemia Mother   . Hypertension Mother   . Hyperlipidemia Father   . Hypertension Father   . Arthritis Maternal Grandmother   . Colon cancer Paternal Grandmother   . Arthritis Paternal Grandmother    Social History   Tobacco Use  . Smoking status: Former Smoker    Last attempt to quit: 08/26/2004    Years since quitting: 14.3  . Smokeless tobacco: Never Used  Substance Use Topics  . Alcohol use: Yes    Alcohol/week: 0.0 - 5.0 standard drinks    Comment: 0-5 drinks/week  . Drug use: No      Review of Systems Per HPI    Objective:   Physical Exam Vitals signs reviewed.  Constitutional:      Appearance: Normal appearance. He is normal weight. He is not ill-appearing.  HENT:     Head: Normocephalic and atraumatic.  Cardiovascular:     Rate and Rhythm: Normal rate.  Pulmonary:     Effort: Pulmonary effort is normal.  Musculoskeletal:     Comments: Right great toe with mild erythema at DIP, no warmth, mild tenderness to palpation.   Skin:    General: Skin is warm and dry.  Neurological:     Mental Status: He is alert and oriented to  person, place, and time.  Psychiatric:        Mood and Affect: Mood normal.        Behavior: Behavior normal.        Thought Content: Thought content normal.        Judgment: Judgment normal.      BP 138/86   Pulse 86   Temp 98.4 F (36.9 C) (Oral)   Ht 6' (1.829 m)   Wt 221 lb (100.2 kg)   BMI 29.97 kg/m  Wt Readings from Last 3 Encounters:  12/17/18 221 lb (100.2 kg)  05/09/18 209 lb (94.8 kg)  12/04/15 199 lb 8 oz (90.5 kg)        Assessment & Plan:  1. Gout of big toe - Provided written and verbal information regarding diagnosis and treatment. - this episode resolving, provided refill of indomethacin for occasional prn use - indomethacin (INDOCIN) 50 MG capsule; Take 1 capsule (50 mg total) by mouth 3 (three) times daily as needed.  Dispense: 15 capsule; Refill: 0  2. HSV-1 (herpes simplex virus 1) infection - valACYclovir (VALTREX) 1000 MG tablet; Take 1 tablet (1,000 mg total) by mouth 2 (two) times daily.  Dispense: 30 tablet; Refill: 1  - encouraged him to follow  up with PCP for CPE and bring most recent labs for scanning to EMR  Olean Reeeborah Millisa Giarrusso, FNP-BC  [facility], [facility]  12/17/2018 1:04 PM

## 2018-12-21 IMAGING — US US ABDOMEN COMPLETE
1 series · 13 of 25 positions shown · non-contrast
Comparison: None.

CLINICAL DATA: Upper abdominal pain

EXAM:
ABDOMEN ULTRASOUND COMPLETE

[Series 1: us abdomen complete · 0.25mm/px · 13 of 110 slices shown]
[im 1/110]
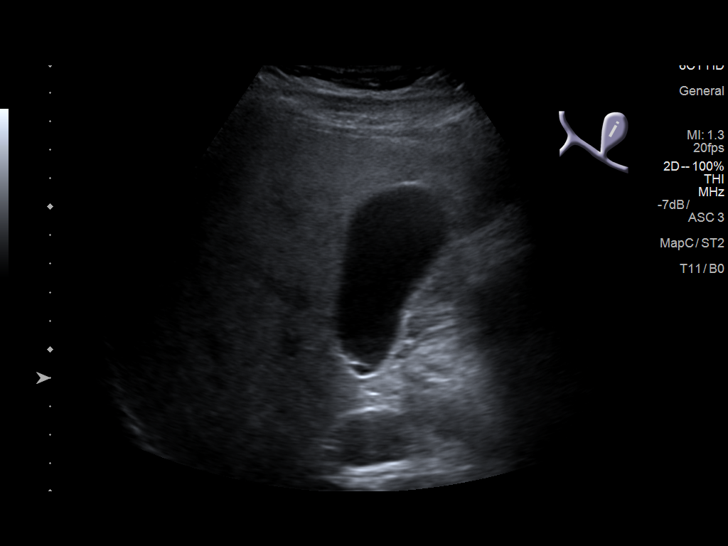
[im 10/110]
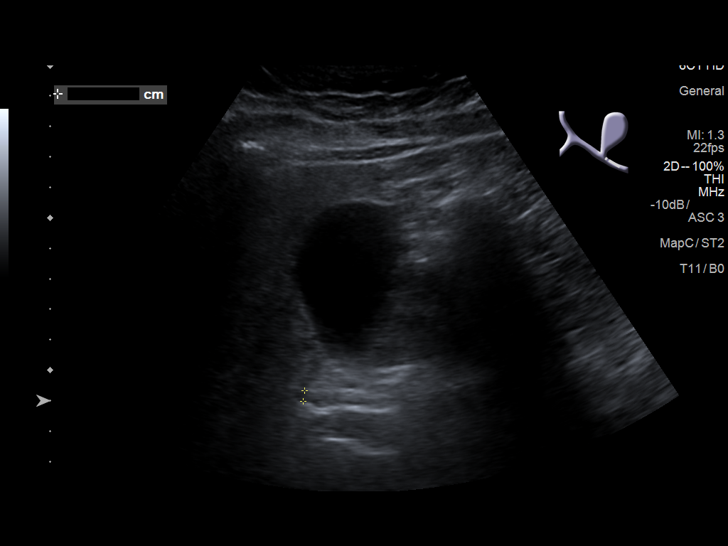
[im 19/110]
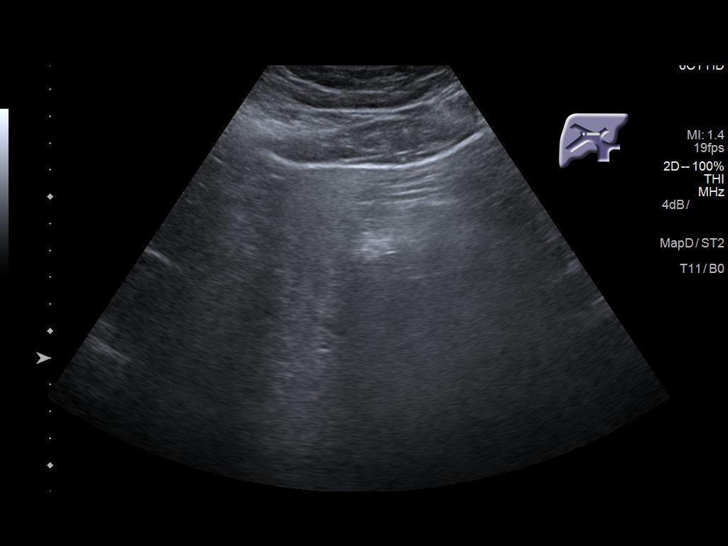
[im 28/110]
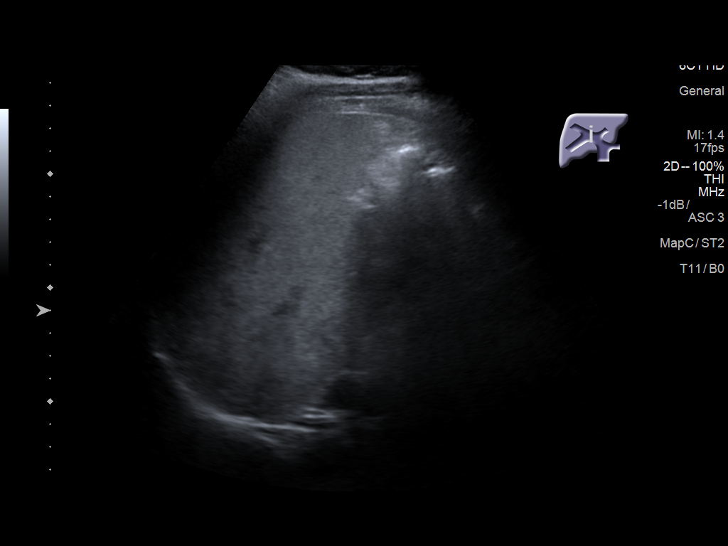
[im 37/110]
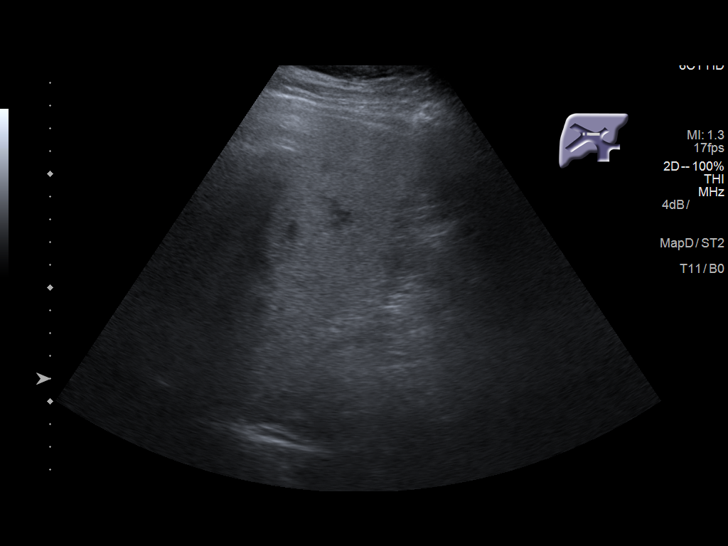
[im 46/110]
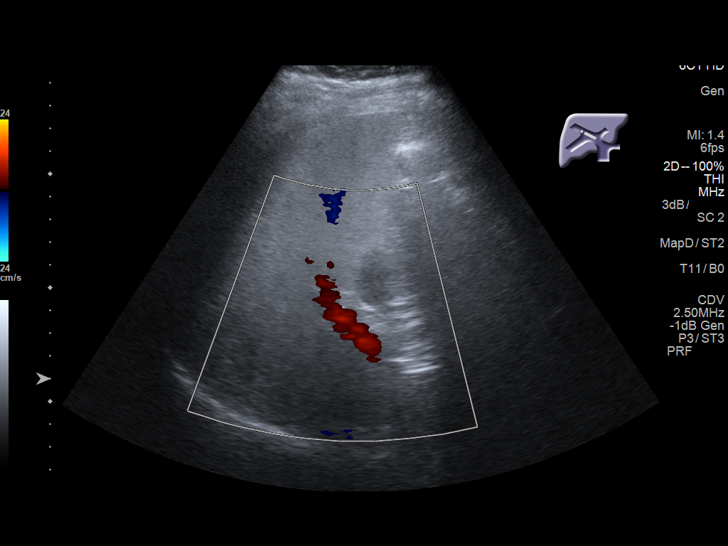
[im 55/110]
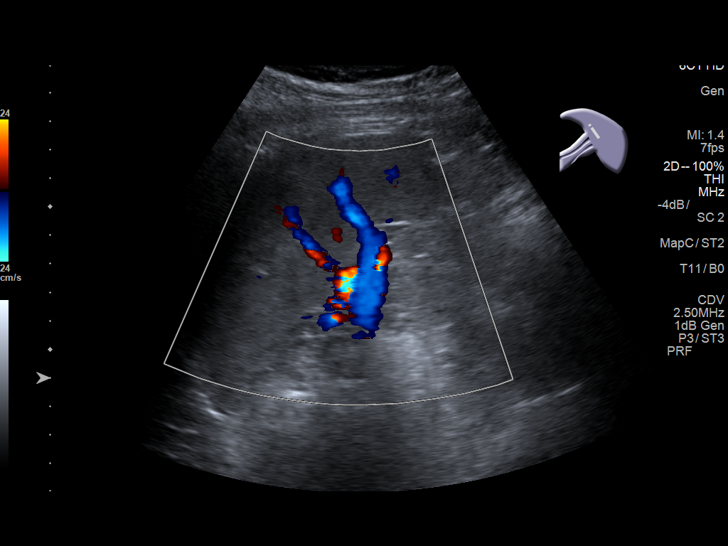
[im 64/110]
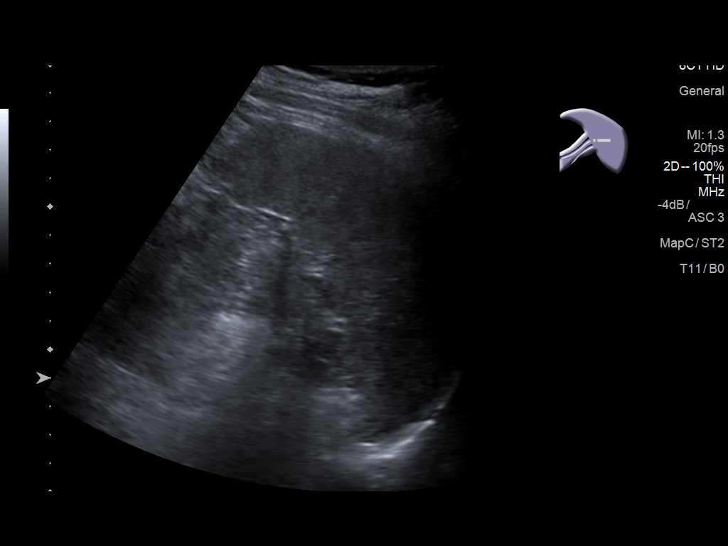
[im 73/110]
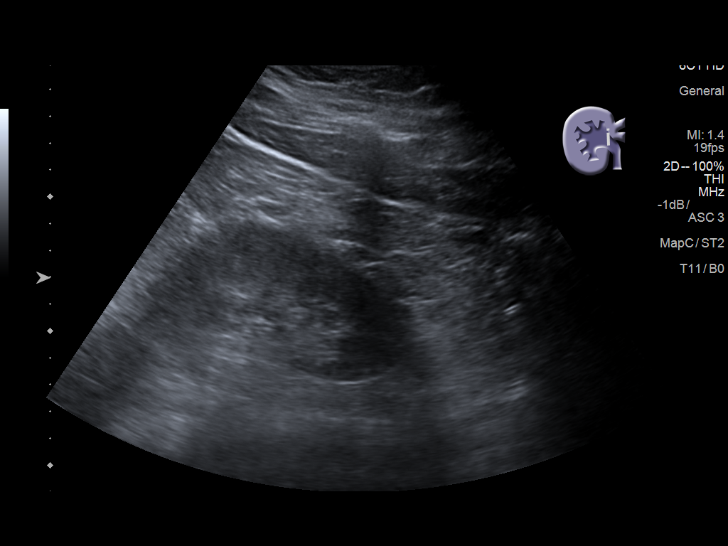
[im 82/110]
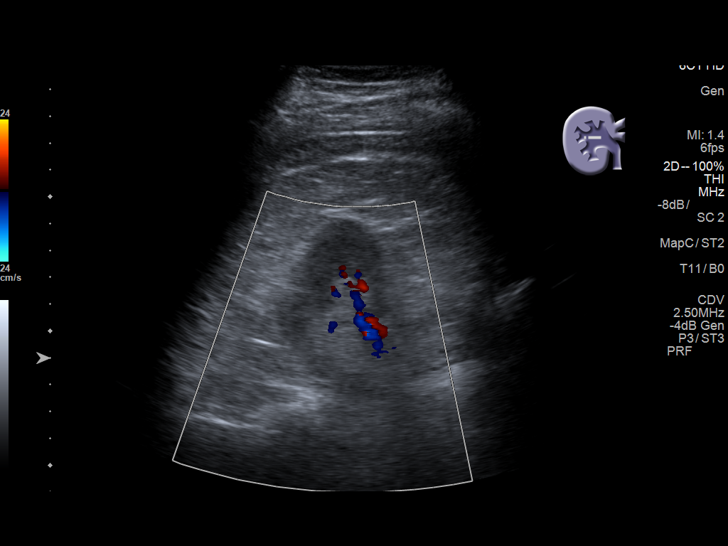
[im 91/110]
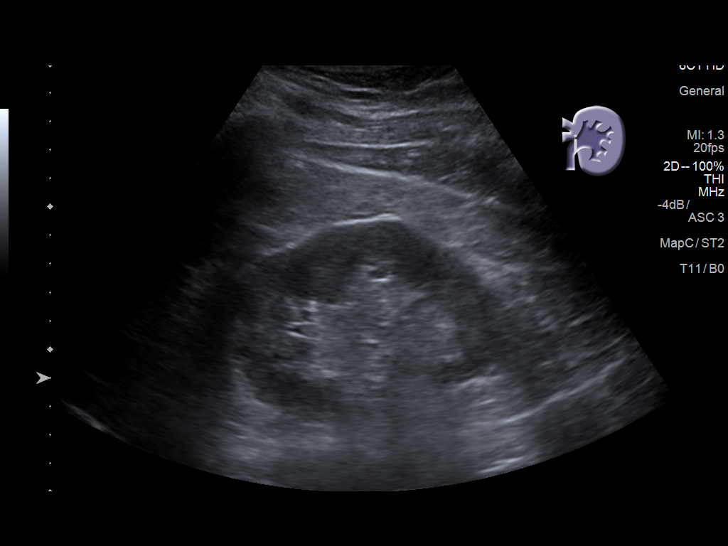
[im 100/110]
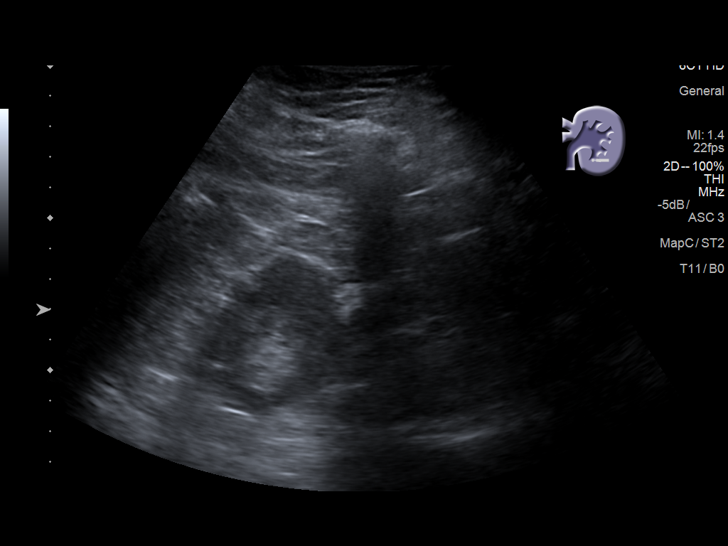
[im 110/110]
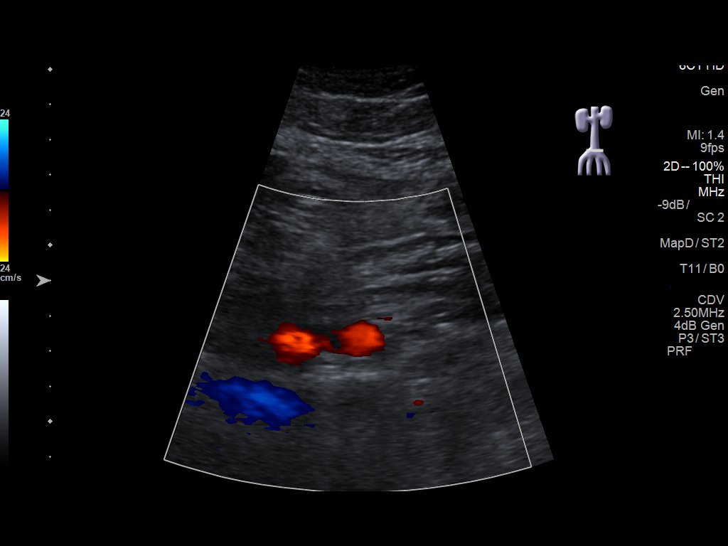

[13 of 25 positions shown; findings below may reference images not displayed]

FINDINGS: Gallbladder: No gallstones or wall thickening visualized. There is
no pericholecystic fluid. No sonographic Murphy sign noted by
sonographer.

Common bile duct: Diameter: 4 mm. No intrahepatic, common hepatic,
or common bile duct dilatation.

Liver: No focal lesion identified. Liver echogenicity overall is
increased. Portal vein is patent on color Doppler imaging with
normal direction of blood flow towards the liver.

IVC: No abnormality visualized.

Pancreas: Visualized portion unremarkable. Portions of pancreas
obscured by gas.

Spleen: Size and appearance within normal limits.

Right Kidney: Length: 11.1 cm. Echogenicity within normal limits. No
mass or hydronephrosis visualized.

Left Kidney: Length: 11.3 cm. Echogenicity within normal limits. No
mass or hydronephrosis visualized.

Abdominal aorta: No aneurysm visualized.

Other findings: No demonstrable ascites.
IMPRESSION: 1. Increase in liver echogenicity, a finding most likely indicative
of hepatic steatosis. While no focal liver lesions are evident, it
must be cautioned that sensitivity of ultrasound for detection of
focal liver lesions is diminished in this circumstance.

2. Portions of pancreas obscured by gas. Visualized portions of
pancreas appear unremarkable.

3.  Study otherwise unremarkable.

## 2019-07-24 ENCOUNTER — Other Ambulatory Visit (INDEPENDENT_AMBULATORY_CARE_PROVIDER_SITE_OTHER): Payer: 59

## 2019-07-24 ENCOUNTER — Telehealth: Payer: Self-pay | Admitting: Family Medicine

## 2019-07-24 ENCOUNTER — Other Ambulatory Visit: Payer: Self-pay

## 2019-07-24 DIAGNOSIS — Z13220 Encounter for screening for lipoid disorders: Secondary | ICD-10-CM

## 2019-07-24 DIAGNOSIS — M1A9XX0 Chronic gout, unspecified, without tophus (tophi): Secondary | ICD-10-CM | POA: Diagnosis not present

## 2019-07-24 LAB — COMPREHENSIVE METABOLIC PANEL
ALT: 30 U/L (ref 0–53)
AST: 20 U/L (ref 0–37)
Albumin: 4.8 g/dL (ref 3.5–5.2)
Alkaline Phosphatase: 56 U/L (ref 39–117)
BUN: 14 mg/dL (ref 6–23)
CO2: 29 mEq/L (ref 19–32)
Calcium: 9.5 mg/dL (ref 8.4–10.5)
Chloride: 103 mEq/L (ref 96–112)
Creatinine, Ser: 0.93 mg/dL (ref 0.40–1.50)
GFR: 89.92 mL/min (ref >60.00)
Glucose, Bld: 105 mg/dL — ABNORMAL HIGH (ref 70–99)
Potassium: 4 mEq/L (ref 3.5–5.1)
Sodium: 140 mEq/L (ref 135–145)
Total Bilirubin: 0.7 mg/dL (ref 0.2–1.2)
Total Protein: 6.7 g/dL (ref 6.0–8.3)

## 2019-07-24 LAB — CBC WITH DIFFERENTIAL/PLATELET
Basophils Absolute: 0 10*3/uL (ref 0.0–0.1)
Basophils Relative: 0.4 % (ref 0.0–3.0)
Eosinophils Absolute: 0.2 10*3/uL (ref 0.0–0.7)
Eosinophils Relative: 4 % (ref 0.0–5.0)
HCT: 43.8 % (ref 39.0–52.0)
Hemoglobin: 14.7 g/dL (ref 13.0–17.0)
Lymphocytes Relative: 30.1 % (ref 12.0–46.0)
Lymphs Abs: 1.6 10*3/uL (ref 0.7–4.0)
MCHC: 33.6 g/dL (ref 30.0–36.0)
MCV: 89.2 fl (ref 78.0–100.0)
Monocytes Absolute: 0.4 10*3/uL (ref 0.1–1.0)
Monocytes Relative: 8.2 % (ref 3.0–12.0)
Neutro Abs: 3 10*3/uL (ref 1.4–7.7)
Neutrophils Relative %: 57.3 % (ref 43.0–77.0)
Platelets: 222 10*3/uL (ref 150.0–400.0)
RBC: 4.91 Mil/uL (ref 4.22–5.81)
RDW: 13.9 % (ref 11.5–15.5)
WBC: 5.2 10*3/uL (ref 4.0–10.5)

## 2019-07-24 LAB — LIPID PANEL
Cholesterol: 207 mg/dL — ABNORMAL HIGH (ref 0–200)
HDL: 39.1 mg/dL (ref >39.00)
NonHDL: 167.81
Total CHOL/HDL Ratio: 5
Triglycerides: 245 mg/dL — ABNORMAL HIGH (ref 0.0–149.0)
VLDL: 49 mg/dL — ABNORMAL HIGH (ref 0.0–40.0)

## 2019-07-24 LAB — URIC ACID: Uric Acid, Serum: 8.7 mg/dL — ABNORMAL HIGH (ref 4.0–7.8)

## 2019-07-24 LAB — LDL CHOLESTEROL, DIRECT: Direct LDL: 129 mg/dL

## 2019-07-24 NOTE — Telephone Encounter (Signed)
-----   Message from Ellamae Sia sent at 07/15/2019  3:43 PM EDT ----- Regarding: Lab orders for Wednesday, 8.12.20 Patient is scheduled for CPX labs, please order future labs, Thanks , Karna Christmas

## 2019-07-26 NOTE — Progress Notes (Signed)
No critical labs need to be addressed urgently. We will discuss labs in detail at upcoming office visit.   

## 2019-07-30 ENCOUNTER — Encounter: Payer: 59 | Admitting: Family Medicine

## 2019-07-30 ENCOUNTER — Encounter: Payer: Self-pay | Admitting: Family Medicine

## 2019-07-30 ENCOUNTER — Ambulatory Visit (INDEPENDENT_AMBULATORY_CARE_PROVIDER_SITE_OTHER): Payer: 59 | Admitting: Family Medicine

## 2019-07-30 VITALS — BP 136/94 | HR 72 | Temp 97.4°F | Ht 72.0 in | Wt 208.0 lb

## 2019-07-30 DIAGNOSIS — M1A9XX0 Chronic gout, unspecified, without tophus (tophi): Secondary | ICD-10-CM | POA: Diagnosis not present

## 2019-07-30 DIAGNOSIS — Z0000 Encounter for general adult medical examination without abnormal findings: Secondary | ICD-10-CM | POA: Diagnosis not present

## 2019-07-30 NOTE — Patient Instructions (Addendum)
Follow BP at call if persistently > 140/90.   Work on low purine diet, low carb, low cholesterol.INCrease exercie sas able.  Call to schedule  Physical with ;labs prior in 1 year. Call if you need a referral to a urologist for testosterone replacement.

## 2019-07-30 NOTE — Progress Notes (Signed)
VIRTUAL VISIT Due to national recommendations of social distancing due to COVID 19, a virtual visit is felt to be most appropriate for this patient at this time.   I connected with the patient on 07/30/19 at  3:40 PM EDT by virtual telehealth platform and verified that I am speaking with the correct person using two identifiers.   I discussed the limitations, risks, security and privacy concerns of performing an evaluation and management service by  virtual telehealth platform and the availability of in person appointments. I also discussed with the patient that there may be a patient responsible charge related to this service. The patient expressed understanding and agreed to proceed.  Patient location: Home Provider [facility] Participants: Kerby NoraAmy Dynisha Due and [place]had Rodriguez   Chief Complaint  Patient presents with  . Annual Exam    History of Present Illness: The patient is here for annual wellness exam and preventative care.    Cholesterol higher than in past years but 1.9% risk of CVD in next 10 years per AHA risk calc. Lab Results  Component Value Date   CHOL 207 (H) 07/24/2019   HDL 39.10 07/24/2019   LDLCALC 94 08/18/2015   LDLDIRECT 129.0 07/24/2019   TRIG 245.0 (H) 07/24/2019   CHOLHDL 5 07/24/2019   Gout: he has had 1 flare of gout in last year.  Lab Results  Component Value Date   LABURIC 8.7 (H) 07/24/2019    Checked at home on arm cuff... no previous elevation.. 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   no longer going [facility] injections in last year... he has noted  Increase in fatigue, sleeping issues.  COVID 19 screen No recent travel or  known exposure to COVID19 The patient denies respiratory symptoms of COVID 19 at this time.  The importance of social distancing was discussed today.   Review of Systems  Constitutional: Negative for chills and fever.  HENT: Negative for congestion and ear pain.   Eyes: Negative for pain and redness.  Respiratory: Negative for cough and shortness of breath.   Cardiovascular: Negative for chest pain, palpitations and leg swelling.  Gastrointestinal: Negative for abdominal pain, blood in stool, constipation, diarrhea, nausea and vomiting.  Genitourinary: Negative for dysuria.  Musculoskeletal: Negative for falls and myalgias.  Skin: Negative for rash.  Neurological: Negative for dizziness.  Psychiatric/Behavioral: Negative for depression. The patient is not nervous/anxious.       Past Medical History:  Diagnosis Date  . History of fracture of lower leg     reports that he quit smoking about 14 years ago. He has never used smokeless tobacco. He reports current alcohol use. He reports that he does not use drugs.   Current Outpatient Medications:  .  fluticasone (FLONASE) 50 MCG/ACT nasal spray, Place 2 sprays into both nostrils daily., Disp: 16 g, Rfl: 6 .  indomethacin (INDOCIN) 50 MG capsule, Take 1 capsule (50 mg total) by mouth 3 (three) times daily as needed., Disp: 15 capsule, Rfl: 0 .  Multiple Vitamin (MULTIVITAMIN) tablet, Take 1 tablet by mouth daily., Disp: , Rfl:  .  valACYclovir (VALTREX) 1000 MG tablet, Take 1,000 mg by  mouth 2 (two) times daily as needed., Disp: , Rfl:  .  testosterone cypionate (DEPOTESTOTERONE CYPIONATE) 100 MG/ML injection, Inject 200 mg into the muscle every 14 (fourteen) days. For IM use only, Disp: , Rfl:    Observations/Objective: Blood pressure (!) 136/94, pulse 72, temperature (!) 97.4 F (36.3 C), temperature source Oral, height 6' (1.829 m), weight 208 lb (94.3 kg), SpO2 97 %.  Physical Exam  Physical Exam Constitutional:      General: The  patient is not in acute distress. Pulmonary:     Effort: Pulmonary effort is normal. No respiratory distress.  Neurological:     Mental Status: The patient is alert and oriented to person, place, and time.  Psychiatric:        Mood and Affect: Mood normal.        Behavior: Behavior normal.   Assessment and Plan The patient's preventative maintenance and recommended screening tests for an annual wellness exam were reviewed in full today. Brought up to date unless services declined.  Counselled on the importance of diet, exercise, and its role in overall health and mortality. The patient's FH and SH was reviewed, including their home life, tobacco status, and drug and alcohol status.   Vaccines: Flu  gets at work.. Tdap 2016 STD testing: refused. Remote smoker: 2 ppd QUIT 2005. Former smoker  no early family history of prostate cancer or colon cancer.   I discussed the assessment and treatment plan with the patient. The patient was provided an opportunity to ask questions and all were answered. The patient agreed with the plan and demonstrated an understanding of the instructions.   The patient was advised to call back or seek an in-person evaluation if the symptoms worsen or if the condition fails to improve as anticipated.      Eliezer Lofts, MD

## 2019-07-30 NOTE — Assessment & Plan Note (Signed)
1 flare in last year. Refused allopurinol given rarity of flare and no uric acid stones/ joint changes... will work on low purine diet.

## 2019-09-04 ENCOUNTER — Telehealth: Payer: Self-pay

## 2019-09-04 DIAGNOSIS — I10 Essential (primary) hypertension: Secondary | ICD-10-CM

## 2019-09-04 NOTE — Telephone Encounter (Addendum)
Pt did virtual annual visit on 07/30/19; pt was to monitor BP readings and to cb if BP > 140/90. Pt said last wk pt had a blood vessel to rupture in eye and then he began to monitor BP again; on 08/30/19 BP 132/84  And later on 08/30/19 BP was 150/98.   On 09/02/19 at 430PM BP 140/90 P 75 sitting and  laying BP 133/87 P64. On 09/03/19 BP 127/80 P 63 laying down. On 09/04/19 at 12:10 AM BP 132/91 P 80 and on 09/04/19 at 7 AM BP 148/90 09/03/18 at 2PM BP 146/94 P70 No CP,SOB,dizziness,weakness in extremities or vision changes. Pt has had dull H/A that goes down back of head to neck with pain level 1-2 on and off. Pt has been watching diet, since 08/30/19 no caffeine,soda, or alcohol. Pt is exercising more. Pt has no covid symptoms except H/A, no travel and no known exposure to + covid. Pt is not taking any med for elevated BP. Pt request Dr Diona Browner review and then cb to pt. [facility]. ED precautions given and pt voiced understanding.

## 2019-09-06 DIAGNOSIS — I10 Essential (primary) hypertension: Secondary | ICD-10-CM | POA: Insufficient documentation

## 2019-09-06 MED ORDER — LOSARTAN POTASSIUM-HCTZ 50-12.5 MG PO TABS: 1.0000 | ORAL_TABLET | Freq: Every day | ORAL | 11 refills | Status: DC

## 2019-09-06 NOTE — Telephone Encounter (Signed)
BP running to high. New diagnosis hypertension. Testosterone may be contributing... make sure to discuss with the uro at next appt. Start losartan HCTZ 50/12.5mg  daily.  Follow BPs at home and make 2 week follow up appt for BP check in office.  Heart healthy diet, exercsie and weight loss as well.

## 2019-09-06 NOTE — Telephone Encounter (Signed)
Peter Fuller notified as instructed by telephone.  He states he discussed with Dr. Diona Browner at his last office visit that he no longer sees his urologist due to his urologist is no longer a provider that his  Insurance covers.  Follow up appointment scheduled for 09/20/2019 at 9:40 am to follow up on BP with Dr. Diona Browner.

## 2019-09-20 ENCOUNTER — Other Ambulatory Visit: Payer: Self-pay

## 2019-09-20 ENCOUNTER — Ambulatory Visit: Payer: 59 | Admitting: Family Medicine

## 2019-09-20 ENCOUNTER — Encounter: Payer: Self-pay | Admitting: Family Medicine

## 2019-09-20 VITALS — BP 130/76 | HR 82 | Temp 97.7°F | Ht 72.0 in | Wt 203.5 lb

## 2019-09-20 DIAGNOSIS — I10 Essential (primary) hypertension: Secondary | ICD-10-CM | POA: Diagnosis not present

## 2019-09-20 NOTE — Progress Notes (Signed)
Chief Complaint  Patient presents with  . Follow-up    HTN    History of Present Illness: HPI   38- year old male presents for follow up  New diagnosis HTN. Noted at [facility] on 07/30/2019 no clear lab abnormality Verified at home.  Started on lisinopril HCTZ 50/12.5 mg daily on 09/04/2019.  Hypertension:   Significant improvement  Of BP with diet and exercise.  Never started medication.  Cut out sodium, no soda, no caffeine. No ETOH. Decreased stress. Wt Readings from Last 3 Encounters:  09/20/19 203 lb 8 oz (92.3 kg)  07/30/19 208 lb (94.3 kg)  12/17/18 221 lb (100.2 kg)    BP Readings from Last 3 Encounters:  09/20/19 130/76  07/30/19 (!) 136/94  12/17/18 138/86  Using medication without problems or lightheadedness:  none Chest pain with exertion: none Edema:none Short of breath: Average home BPs: 119-130/72-80 Other issues:  Has been walking and cardio, some weight lifting 4 times a week.   COVID 19 screen No recent travel or known exposure to COVID19 The patient denies respiratory symptoms of COVID 19 at this time.  The importance of social distancing was discussed today.   Review of Systems  Constitutional: Negative for chills and fever.  HENT: Negative for congestion and ear pain.   Eyes: Negative for pain and redness.  Respiratory: Negative for cough and shortness of breath.   Cardiovascular: Negative for chest pain, palpitations and leg swelling.  Gastrointestinal: Negative for abdominal pain, blood in stool, constipation, diarrhea, nausea and vomiting.  Genitourinary: Negative for dysuria.  Musculoskeletal: Negative for falls and myalgias.  Skin: Negative for rash.  Neurological: Negative for dizziness.  Psychiatric/Behavioral: Negative for depression. The patient is not nervous/anxious.       Past Medical History:  Diagnosis Date  . History of fracture of lower leg     reports that he quit smoking about 15 years ago. He has never used smokeless  tobacco. He reports current alcohol use. He reports that he does not use drugs.   Current Outpatient Medications:  .  fluticasone (FLONASE) 50 MCG/ACT nasal spray, Place 2 sprays into both nostrils daily., Disp: 16 g, Rfl: 6 .  indomethacin (INDOCIN) 50 MG capsule, Take 1 capsule (50 mg total) by mouth 3 (three) times daily as needed., Disp: 15 capsule, Rfl: 0 .  Multiple Vitamin (MULTIVITAMIN) tablet, Take 1 tablet by mouth daily., Disp: , Rfl:  .  valACYclovir (VALTREX) 1000 MG tablet, Take 1,000 mg by mouth 2 (two) times daily as needed., Disp: , Rfl:  .  losartan-hydrochlorothiazide (HYZAAR) 50-12.5 MG tablet, Take 1 tablet by mouth daily. (Patient not taking: Reported on 09/20/2019), Disp: 30 tablet, Rfl: 11 .  testosterone cypionate (DEPOTESTOTERONE CYPIONATE) 100 MG/ML injection, Inject 200 mg into the muscle every 14 (fourteen) days. For IM use only, Disp: , Rfl:    Observations/Objective: Blood pressure 130/76, pulse 82, temperature 97.7 F (36.5 C), temperature source Temporal, height 6' (1.829 m), weight 203 lb 8 oz (92.3 kg), SpO2 98 %.  Physical Exam Constitutional:      Appearance: He is well-developed.  HENT:     Head: Normocephalic.     Right Ear: Hearing normal.     Left Ear: Hearing normal.     Nose: Nose normal.  Neck:     Thyroid: No thyroid mass or thyromegaly.     Vascular: No carotid bruit.     Trachea: Trachea normal.  Cardiovascular:     Rate and Rhythm: Normal  rate and regular rhythm.     Pulses: Normal pulses.     Heart sounds: Heart sounds not distant. No murmur. No friction rub. No gallop.      Comments: No peripheral edema Pulmonary:     Effort: Pulmonary effort is normal. No respiratory distress.     Breath sounds: Normal breath sounds.  Skin:    General: Skin is warm and dry.     Findings: No rash.  Psychiatric:        Speech: Speech normal.        Behavior: Behavior normal.        Thought Content: Thought content normal.      Assessment and  Plan Benign essential hypertension BP controlled with diet, exercise, decrease caffeine and salt and resulting weight loss.  No med indicated.  Urine protein at work was increased.. recheck in 6 months with work fit test.       Eliezer Lofts, MD

## 2019-09-20 NOTE — Patient Instructions (Signed)
Keep up great work on healthy eating and regular diet.

## 2019-09-20 NOTE — Assessment & Plan Note (Signed)
BP controlled with diet, exercise, decrease caffeine and salt and resulting weight loss.  No med indicated.  Urine protein at work was increased.. recheck in 6 months with work fit test.

## 2020-04-11 ENCOUNTER — Other Ambulatory Visit: Payer: Self-pay | Admitting: Family Medicine

## 2020-04-11 DIAGNOSIS — B009 Herpesviral infection, unspecified: Secondary | ICD-10-CM

## 2020-04-13 NOTE — Telephone Encounter (Signed)
Will route to [employer]

## 2020-04-13 NOTE — Telephone Encounter (Signed)
Pt left v/m requesting cb about  Status of refill for med recently requested for refill.

## 2020-04-13 NOTE — Telephone Encounter (Signed)
Last office visit 09/20/2019 for follow up HTN.  Last refilled 12/27/2018 for #30 with 1 refill.  CPE scheduled for 07/31/2020.

## 2020-05-29 ENCOUNTER — Telehealth: Payer: Self-pay

## 2020-05-29 NOTE — Telephone Encounter (Signed)
[facility] Night - Client TELEPHONE ADVICE RECORD [employer] Patient Name: Peter Fuller Gender: Male DOB: June 05, 1981 Age: 39 Y 3 D Return Phone Number: [phone] (Primary) Address: City/State/Zip: [place] [address] Client [facility] Night - Client Client Site [facility] - Night Physician Kerby Nora - MD Contact Type Call Who Is Calling Patient / Member / Family / Caregiver Call Type Triage / Clinical Relationship To Patient Self Return Phone Number [phone] (Primary) Chief Complaint Cough Reason for Call Symptomatic / Request for Health Information Initial Comment Caller states he needs a rapid covid test done. He has a cough for 4 days and greenish brown mucus. Translation No Nurse Assessment Nurse: Clarita Leber, RN, Deborah Date/Time (Eastern Time): 05/29/2020 2:05:40 PM Confirm and document reason for call. If symptomatic, describe symptoms. ---The caller states that he has a cough and he is coughing up greenish brown mucus. Had fever a few days ago but none now. He had a mild sore throat earlier. Had COVID in February. Has the patient had close contact with a person known or suspected to have the novel coronavirus illness OR traveled / lives in area with major community spread (including international travel) in the last 14 days from the onset of symptoms? * If Asymptomatic, screen for exposure and travel within the last 14 days. ---No Does the patient have any new or worsening symptoms? ---Yes Will a triage be completed? ---Yes Related visit to physician within the last 2 weeks? ---No Does the PT have any chronic conditions? (i.e. diabetes, asthma, this includes High risk factors for pregnancy, etc.) ---No Is this a behavioral health or substance abuse call? ---No Guidelines Guideline Title Affirmed Question Affirmed Notes Nurse Date/Time Lamount Cohen Time) COVID-19 - Diagnosed or  Suspected COVID-19 Testing, questions about Clarita Leber, RN, Gavin Pound 05/29/2020 2:08:37 PM Disp. Time Lamount Cohen Time) Disposition Final User 05/29/2020 2:16:21 PM Home Care Yes Womble, RN, Gavin Pound PLEASE NOTE: All timestamps contained within this report are represented as [place] Standard Time. CONFIDENTIALTY NOTICE: This fax transmission is intended only for the addressee. It contains information that is legally privileged, confidential or otherwise protected from use or disclosure. If you are not the intended recipient, you are strictly prohibited from reviewing, disclosing, copying using or disseminating any of this information or taking any action in reliance on or regarding this information. If you have received this fax in error, please notify us immediately by telephone so that we can arrange for its return to [place]. Phone: [phone], Toll-Free: [phone], Fax: [phone] Page: 2 of 2 Call Id: [id number] Caller Disagree/Comply Comply Caller Understands Yes PreDisposition Home Care Care Advice Given Per Guideline HOME CARE: * You have more questions. Comments User: Alita Chyle, RN Date/Time Lamount Cohen Time): 05/29/2020 2:17:17 PM [phone] was given to them

## 2020-07-24 ENCOUNTER — Other Ambulatory Visit (INDEPENDENT_AMBULATORY_CARE_PROVIDER_SITE_OTHER): Payer: 59

## 2020-07-24 ENCOUNTER — Telehealth: Payer: Self-pay | Admitting: Family Medicine

## 2020-07-24 ENCOUNTER — Other Ambulatory Visit: Payer: Self-pay

## 2020-07-24 DIAGNOSIS — M1A9XX0 Chronic gout, unspecified, without tophus (tophi): Secondary | ICD-10-CM | POA: Diagnosis not present

## 2020-07-24 DIAGNOSIS — I10 Essential (primary) hypertension: Secondary | ICD-10-CM

## 2020-07-24 LAB — COMPREHENSIVE METABOLIC PANEL
ALT: 28 U/L (ref 0–53)
AST: 20 U/L (ref 0–37)
Albumin: 4.6 g/dL (ref 3.5–5.2)
Alkaline Phosphatase: 55 U/L (ref 39–117)
BUN: 18 mg/dL (ref 6–23)
CO2: 29 mEq/L (ref 19–32)
Calcium: 9.5 mg/dL (ref 8.4–10.5)
Chloride: 103 mEq/L (ref 96–112)
Creatinine, Ser: 1.08 mg/dL (ref 0.40–1.50)
GFR: 75.29 mL/min (ref >60.00)
Glucose, Bld: 95 mg/dL (ref 70–99)
Potassium: 3.9 mEq/L (ref 3.5–5.1)
Sodium: 139 mEq/L (ref 135–145)
Total Bilirubin: 0.5 mg/dL (ref 0.2–1.2)
Total Protein: 7 g/dL (ref 6.0–8.3)

## 2020-07-24 LAB — LIPID PANEL
Cholesterol: 199 mg/dL (ref 0–200)
HDL: 38.3 mg/dL — ABNORMAL LOW (ref >39.00)
LDL Cholesterol: 133 mg/dL — ABNORMAL HIGH (ref 0–99)
NonHDL: 160.97
Total CHOL/HDL Ratio: 5
Triglycerides: 138 mg/dL (ref 0.0–149.0)
VLDL: 27.6 mg/dL (ref 0.0–40.0)

## 2020-07-24 LAB — URIC ACID: Uric Acid, Serum: 8.1 mg/dL — ABNORMAL HIGH (ref 4.0–7.8)

## 2020-07-24 NOTE — Telephone Encounter (Signed)
-----   Message from Aquilla Solian, RT sent at 07/09/2020  1:41 PM EDT ----- Regarding: Lab Orders for Friday 8.13.2021 Please place lab orders for Friday 8.13.2021, office visit for physical on Friday 8.20.2021 Thank you, Jones Bales RT(R)

## 2020-07-24 NOTE — Progress Notes (Signed)
No critical labs need to be addressed urgently. We will discuss labs in detail at upcoming office visit.   

## 2020-07-31 ENCOUNTER — Ambulatory Visit (INDEPENDENT_AMBULATORY_CARE_PROVIDER_SITE_OTHER): Payer: 59 | Admitting: Family Medicine

## 2020-07-31 ENCOUNTER — Other Ambulatory Visit: Payer: Self-pay

## 2020-07-31 ENCOUNTER — Encounter: Payer: Self-pay | Admitting: Family Medicine

## 2020-07-31 VITALS — BP 126/82 | HR 86 | Temp 98.1°F | Ht 71.5 in | Wt 210.4 lb

## 2020-07-31 DIAGNOSIS — Z0000 Encounter for general adult medical examination without abnormal findings: Secondary | ICD-10-CM

## 2020-07-31 DIAGNOSIS — R7989 Other specified abnormal findings of blood chemistry: Secondary | ICD-10-CM

## 2020-07-31 DIAGNOSIS — R809 Proteinuria, unspecified: Secondary | ICD-10-CM | POA: Diagnosis not present

## 2020-07-31 DIAGNOSIS — M1A9XX0 Chronic gout, unspecified, without tophus (tophi): Secondary | ICD-10-CM | POA: Diagnosis not present

## 2020-07-31 DIAGNOSIS — I10 Essential (primary) hypertension: Secondary | ICD-10-CM | POA: Diagnosis not present

## 2020-07-31 LAB — POCT URINALYSIS DIP (CLINITEK)
Bilirubin, UA: NEGATIVE
Blood, UA: NEGATIVE
Glucose, UA: NEGATIVE mg/dL
Ketones, POC UA: NEGATIVE mg/dL
Leukocytes, UA: NEGATIVE
Nitrite, UA: NEGATIVE
POC PROTEIN,UA: NEGATIVE
Spec Grav, UA: 1.015 (ref 1.010–1.025)
Urobilinogen, UA: 0.2 E.U./dL
pH, UA: 7.5 (ref 5.0–8.0)

## 2020-07-31 NOTE — Progress Notes (Signed)
Chief Complaint  Patient presents with  . Annual Exam    physical , no concern , discuss referral urology     History of Present Illness: HPI  The patient is here for annual wellness exam and preventative care.    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  Decreased hearing noted in left ear.  Hypertension:  At goal on no medicine. Controlled with diet, exercise, decrease caffeine and salt and resulting weight loss BP Readings from Last 3 Encounters:  07/31/20 126/82  09/20/19 130/76  07/30/19 (!) 136/94  Using medication without problems or lightheadedness:  none Chest pain with exertion:none Edema:none Short of breath:none Average home BPs: 120-130/80 Other issues: Exercise: 2-3 times a week  Gout: using indocin prn. Uric acid elevated at 8.1. Flares:   1-2 in last year.  Low testosterone:  Has been off testosterone, feels better when he is on it... Was seeing Dr. Patsi Sears in past.requests referral to URO.  Reviewed labs in detail. Lab Results  Component Value Date   CHOL 199 07/24/2020   HDL 38.30 (L) 07/24/2020   LDLCALC 133 (H) 07/24/2020   LDLDIRECT 129.0 07/24/2019   TRIG 138.0 07/24/2020   CHOLHDL 5 07/24/2020   The 10-year ASCVD risk score Denman George DC Jr., et al., 2013) is: 1.8%   Values used to calculate the score:     Age: 39 years     Sex: Male     Is Non-Hispanic African American: No     Diabetic: No     Tobacco smoker: No     Systolic Blood Pressure: 126 mmHg     Is BP treated: No     HDL Cholesterol: 38.3 mg/dL     Total Cholesterol: 199 mg/dL  At  Work physical.. positive protein in urine.  This visit occurred during the SARS-CoV-2 public health emergency.  Safety protocols were in place, including screening questions prior to the visit, additional usage of staff PPE, and extensive cleaning of exam room while observing appropriate contact time as indicated for disinfecting solutions.   COVID 19 screen:  No recent  travel or known exposure to COVID19 The patient denies respiratory symptoms of COVID 19 at this time. The importance of social distancing was discussed today.     Review of Systems  Constitutional: Negative for chills and fever.  HENT: Negative for congestion and ear pain.   Eyes: Negative for pain and redness.  Respiratory: Negative for cough and shortness of breath.   Cardiovascular: Negative for chest pain, palpitations and leg swelling.  Gastrointestinal: Negative for abdominal pain, blood in stool, constipation, diarrhea, nausea and vomiting.  Genitourinary: Negative for dysuria.  Musculoskeletal: Negative for falls and myalgias.  Skin: Negative for rash.  Neurological: Negative for dizziness.  Psychiatric/Behavioral: Negative for depression. The patient is not nervous/anxious.       Past Medical History:  Diagnosis Date  . History of fracture of lower leg     reports that he quit smoking about 15 years ago. He has never used smokeless tobacco. He reports current alcohol use. He reports that he does not use drugs.   Current Outpatient Medications:  .  fluticasone (FLONASE) 50 MCG/ACT nasal spray, Place 2 sprays into both nostrils daily., Disp: 16 g, Rfl: 6 .  indomethacin (INDOCIN) 50 MG capsule, Take 1 capsule (50 mg total) by mouth 3 (three) times daily as needed., Disp: 15 capsule, Rfl: 0 .  Multiple Vitamin (MULTIVITAMIN) tablet, Take 1 tablet by mouth daily.,  Disp: , Rfl:  .  valACYclovir (VALTREX) 1000 MG tablet, TAKE 1 TABLET BY MOUTH TWICE A DAY, Disp: 30 tablet, Rfl: 1 .  testosterone cypionate (DEPOTESTOTERONE CYPIONATE) 100 MG/ML injection, Inject 200 mg into the muscle every 14 (fourteen) days. For IM use only (Patient not taking: Reported on 07/31/2020), Disp: , Rfl:    Observations/Objective: Blood pressure 126/82, pulse 86, temperature 98.1 F (36.7 C), temperature source Temporal, height 5' 11.5" (1.816 m), weight 210 lb 6.4 oz (95.4 kg), SpO2 96 %.  Physical  Exam Constitutional:      General: He is not in acute distress.    Appearance: Normal appearance. He is well-developed. He is not ill-appearing or toxic-appearing.  HENT:     Head: Normocephalic and atraumatic.     Right Ear: Hearing, tympanic membrane, ear canal and external ear normal.     Left Ear: Hearing, tympanic membrane, ear canal and external ear normal.     Nose: Nose normal.     Mouth/Throat:     Pharynx: Uvula midline.  Eyes:     General: Lids are normal. Lids are everted, no foreign bodies appreciated.     Conjunctiva/sclera: Conjunctivae normal.     Pupils: Pupils are equal, round, and reactive to light.  Neck:     Thyroid: No thyroid mass or thyromegaly.     Vascular: No carotid bruit.     Trachea: Trachea and phonation normal.  Cardiovascular:     Rate and Rhythm: Normal rate and regular rhythm.     Pulses: Normal pulses.     Heart sounds: S1 normal and S2 normal. No murmur heard.  No gallop.   Pulmonary:     Breath sounds: Normal breath sounds. No wheezing, rhonchi or rales.  Abdominal:     General: Bowel sounds are normal.     Palpations: Abdomen is soft.     Tenderness: There is no abdominal tenderness. There is no guarding or rebound.     Hernia: No hernia is present.  Musculoskeletal:     Cervical back: Normal range of motion and neck supple.  Lymphadenopathy:     Cervical: No cervical adenopathy.  Skin:    General: Skin is warm and dry.     Findings: No rash.  Neurological:     Mental Status: He is alert.     Cranial Nerves: No cranial nerve deficit.     Sensory: No sensory deficit.     Gait: Gait normal.     Deep Tendon Reflexes: Reflexes are normal and symmetric.  Psychiatric:        Speech: Speech normal.        Behavior: Behavior normal.        Judgment: Judgment normal.      Assessment and Plan   The patient's preventative maintenance and recommended screening tests for an annual wellness exam were reviewed in full today. Brought up to  date unless services declined.  Counselled on the importance of diet, exercise, and its role in overall health and mortality. The patient's FH and SH was reviewed, including their home life, tobacco status, and drug and alcohol status.   Vaccines:  Discussed COVID19 vaccine side effects and benefits. Strongly encouraged the patient to get the vaccine. Questions answered. Tdap 2016 STD testing: refused. Remote smoker: 2 ppd QUIT 2005.  No early family history of prostate cancer or colon cancer. PSA at work stable Hep C :  Negative hep C test 4 years ago  Benign essential hypertension Controlled  with lifestyle.Encouraged exercise, weight loss, healthy eating habits.   Gout Work on low purine diet.  Not interested in allopurinol. Use indocin prn flare.  Low serum testosterone level He will let me know where he wants to be referred and if he needs Am testosterone testing prior.   Kerby Nora, MD

## 2020-07-31 NOTE — Assessment & Plan Note (Signed)
Work on low purine diet.  Not interested in allopurinol. Use indocin prn flare.

## 2020-07-31 NOTE — Assessment & Plan Note (Signed)
He will let me know where he wants to be referred and if he needs Am testosterone testing prior.

## 2020-07-31 NOTE — Patient Instructions (Signed)
Keep up working on healthy lifestyle.  Call if you need referral for urology or further testing.

## 2020-07-31 NOTE — Assessment & Plan Note (Signed)
Controlled with lifestyle.Encouraged exercise, weight loss, healthy eating habits.

## 2021-01-08 ENCOUNTER — Telehealth: Payer: Self-pay

## 2021-01-08 NOTE — Telephone Encounter (Signed)
Noted  

## 2021-01-08 NOTE — Telephone Encounter (Signed)
Pt left v/m that for 3 days his temp has been at least 101 or more and the highest the fever has been is 102.4. pt also has thick green blood tinged mucus when blows nose and one time prod cough with green phlegm. Pt had PCR covid test on 01/05/21 but has not gotten results back yet the lab is running behind. Pt has fever and chills,runny nose, and head or sinus congestion.  No SOB or H/a; no diarrhea or vomiting; no loss of taste or smell; pt is not sure if exposed to covid since works for [employer] but wears proper PPD. Pt does not feel in any distress and scheduled video visit for Sat Clinic 01/09/21 at 9 AM. Pt will have vital signs ready when CMA calls. UC & ED precautions given and pt voiced understanding. Sending note to Dr Ermalene Searing as PCP and in office and Dr Selena Batten who is out of office today.

## 2021-01-09 ENCOUNTER — Encounter: Payer: Self-pay | Admitting: Family Medicine

## 2021-01-09 ENCOUNTER — Telehealth (INDEPENDENT_AMBULATORY_CARE_PROVIDER_SITE_OTHER): Payer: 59 | Admitting: Family Medicine

## 2021-01-09 VITALS — BP 138/84 | HR 88 | Temp 99.0°F | Wt 207.2 lb

## 2021-01-09 DIAGNOSIS — Z20822 Contact with and (suspected) exposure to covid-19: Secondary | ICD-10-CM

## 2021-01-09 NOTE — Progress Notes (Signed)
I connected with Peter Fuller on 01/09/21 at  9:00 AM EST by video and verified that I am speaking with the correct person using two identifiers.   I discussed the limitations, risks, security and privacy concerns of performing an evaluation and management service by video and the availability of in person appointments. I also discussed with the patient that there may be a patient responsible charge related to this service. The patient expressed understanding and agreed to proceed.  Patient location: Home Provider Location: [facility] Participants: Lynnda Child and Peter Fuller   Subjective:     Peter Fuller is a 40 y.o. male presenting for Fever (X 4 days ), Cough (Waiting for covid results ), and Nasal Congestion (With green mucus )     Fever  Associated symptoms include coughing and ear pain (improving). Pertinent negatives include no chest pain, headaches, sore throat or wheezing.  Cough This is a new problem. Episode onset: 01/06/2021. The problem has been gradually improving. The cough is non-productive. Associated symptoms include ear pain (improving), a fever and nasal congestion. Pertinent negatives include no chest pain, headaches, myalgias, sore throat, shortness of breath or wheezing. The treatment provided mild relief. There is no history of asthma.   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  HENT: Positive for ear pain (improving). Negative for sore throat.   Respiratory: Positive for cough. Negative for shortness of breath and wheezing.   Cardiovascular: Negative for chest pain.  Musculoskeletal: Negative for myalgias.  Neurological: Negative for headaches.     Social History   Tobacco Use  Smoking  Status Former Smoker  . Quit date: 08/26/2004  . Years since quitting: 16.3  Smokeless Tobacco Never Used        Objective:   BP Readings from Last 3 Encounters:  01/09/21 138/84  07/31/20 126/82  09/20/19 130/76   Wt Readings from Last 3 Encounters:  01/09/21 207 lb 3 oz (94 kg)  07/31/20 210 lb 6.4 oz (95.4 kg)  09/20/19 203 lb 8 oz (92.3 kg)   BP 138/84   Pulse 88   Temp 99 F (37.2 C) (Oral)   Wt 207 lb 3 oz (94 kg)   BMI 28.49 kg/m    Physical Exam Constitutional:      Appearance: Normal appearance. He is not ill-appearing.  HENT:     Head: Normocephalic and atraumatic.     Right Ear: External ear normal.     Left Ear: External ear normal.  Eyes:     Conjunctiva/sclera: Conjunctivae normal.  Pulmonary:     Effort: Pulmonary effort is normal. No respiratory distress.  Neurological:     Mental Status: He is alert. Mental status is at baseline.  Psychiatric:        Mood and Affect: Mood normal.        Behavior: Behavior normal.        Thought Content: Thought content normal.        Judgment: Judgment normal.            Assessment & Plan:   Problem List Items Addressed This Visit   None   Visit Diagnoses    Suspected  COVID-19 virus infection    -  Primary     Pt with fever x 3 days. Starting to fell better.   Discussed likely covid vs flu - he is waiting for tests.  Continue to isolate until negative results or at least 5 days and 1 day fever free.   Call next week if sinus symptoms not improving  Return if symptoms worsen or fail to improve.  Lynnda Child, MD

## 2021-01-09 NOTE — Patient Instructions (Addendum)
Continued to quarantine   Based on your symptoms, it looks like you have a virus.   Antibiotics are not need for a viral infection but the following will help:   1. Drink plenty of fluids 2. Get lots of rest  Sinus Congestion 1) Neti Pot (Saline rinse) -- 2 times day -- if tolerated 2) Flonase (Store Brand ok) - once daily 3) Over the counter congestion medications  Cough 1) Cough drops can be helpful 2) Nyquil (or nighttime cough medication) 3) Honey is proven to be one of the best cough medications  4) Cough medicine with Dextromethorphan can also be helpful  Sore Throat 1) Honey as above, cough drops 2) Ibuprofen or Aleve can be helpful 3) Salt water Gargles  If you develop fevers (Temperature >100.4), chills, worsening symptoms or symptoms lasting longer than 10 days call for update

## 2021-01-09 NOTE — Telephone Encounter (Signed)
See encounter from 1/29

## 2021-03-10 ENCOUNTER — Other Ambulatory Visit: Payer: Self-pay

## 2021-03-10 DIAGNOSIS — M109 Gout, unspecified: Secondary | ICD-10-CM

## 2021-03-10 MED ORDER — INDOMETHACIN 50 MG PO CAPS: 50.0000 mg | ORAL_CAPSULE | Freq: Three times a day (TID) | ORAL | 0 refills | Status: DC | PRN

## 2021-03-10 NOTE — Telephone Encounter (Signed)
Pharmacy requests refill on: Indomethacin 50 mg   LAST REFILL: 12/17/2018 (Q-15, R-0) LAST OV: 07/31/2020 NEXT OV: Not Scheduled  PHARMACY: [id number] [place], [place]

## 2021-05-08 ENCOUNTER — Other Ambulatory Visit: Payer: Self-pay | Admitting: Family Medicine

## 2021-05-08 DIAGNOSIS — M109 Gout, unspecified: Secondary | ICD-10-CM

## 2021-05-12 NOTE — Telephone Encounter (Signed)
Pharmacy requests refill on: Indomethacin 50 mg   LAST REFILL: 03/10/2021 (Q-15, R-0) LAST OV: 07/31/2020 NEXT OV: Not Scheduled  PHARMACY: [id number] [place], [place]

## 2021-05-21 ENCOUNTER — Ambulatory Visit: Payer: 59 | Admitting: Family Medicine

## 2021-06-08 ENCOUNTER — Other Ambulatory Visit: Payer: Self-pay | Admitting: Family Medicine

## 2021-06-08 ENCOUNTER — Other Ambulatory Visit: Payer: Self-pay

## 2021-06-08 ENCOUNTER — Encounter: Payer: Self-pay | Admitting: Family Medicine

## 2021-06-08 ENCOUNTER — Ambulatory Visit: Payer: 59 | Admitting: Family Medicine

## 2021-06-08 VITALS — BP 118/90 | HR 69 | Temp 98.1°F | Ht 71.5 in | Wt 213.5 lb

## 2021-06-08 DIAGNOSIS — Z8489 Family history of other specified conditions: Secondary | ICD-10-CM

## 2021-06-08 DIAGNOSIS — I10 Essential (primary) hypertension: Secondary | ICD-10-CM | POA: Diagnosis not present

## 2021-06-08 MED ORDER — LOSARTAN POTASSIUM 25 MG PO TABS: 25.0000 mg | ORAL_TABLET | Freq: Every day | ORAL | 11 refills | Status: DC

## 2021-06-08 NOTE — Progress Notes (Signed)
Patient ID: Peter Fuller, male    DOB: 09/02/1981, 40 y.o.   MRN: [id number]  This visit was conducted in person.  BP 118/90   Pulse 69   Temp 98.1 F (36.7 C) (Temporal)   Ht 5' 11.5" (1.816 m)   Wt 213 lb 8 oz (96.8 kg)   SpO2 94%   BMI 29.36 kg/m    CC: Chief Complaint  Patient presents with   Follow-up    [facility]-Elevated BP    Subjective:   HPI: Peter Biehn is a 40 y.o. male presenting on 06/08/2021 for Follow-up [facility]Elev[facility] BP)  He reports  recent worsening control of HTN.Marland Kitchen previously controlled with lifestyle changes.    Had  BP spike on 6/4  208/164 per EMS. No associated SOB, CP EKG:  unremarkable Troponin x 2. BP went down on own.  Wt Readings from Last 3 Encounters:  06/08/21 213 lb 8 oz (96.8 kg)  01/09/21 207 lb 3 oz (94 kg)  07/31/20 210 lb 6.4 oz (95.4 kg)      At home BP 132-147/82-97.. higher at work. BP Readings from Last 3 Encounters:  06/08/21 118/90  01/09/21 138/84  07/31/20 126/82         Relevant past medical, surgical, family and social history reviewed and updated as indicated. Interim medical history since our last visit reviewed. Allergies and medications reviewed and updated. Outpatient Medications Prior to Visit  Medication Sig Dispense Refill   Aspirin 81 MG CAPS Take 1 capsule by mouth daily.     fluticasone (FLONASE) 50 MCG/ACT nasal spray Place 2 sprays into both nostrils daily as needed for allergies or rhinitis.     indomethacin (INDOCIN) 50 MG capsule TAKE 1 CAPSULE BY MOUTH 3 TIMES DAILY AS NEEDED. 15 capsule 0   Multiple Vitamin (MULTIVITAMIN) tablet Take 1 tablet by mouth daily.     valACYclovir (VALTREX) 1000 MG tablet TAKE 1 TABLET BY MOUTH TWICE A DAY (Patient taking differently: Take 1,000 mg by mouth as needed.) 30 tablet 1   testosterone cypionate (DEPOTESTOTERONE CYPIONATE) 100 MG/ML injection Inject 200 mg into the muscle every 14 (fourteen) days. For IM use only (Patient not  taking: Reported on 06/08/2021)     fluticasone (FLONASE) 50 MCG/ACT nasal spray Place 2 sprays into both nostrils daily. (Patient taking differently: Place 2 sprays into both nostrils as needed.) 16 g 6   No facility-administered medications prior to visit.     Per HPI unless specifically indicated in ROS section below Review of Systems  Constitutional:  Negative for fatigue and fever.  HENT:  Negative for ear pain.   Eyes:  Negative for pain.  Respiratory:  Negative for cough and shortness of breath.   Cardiovascular:  Negative for chest pain, palpitations and leg swelling.  Gastrointestinal:  Negative for abdominal pain.  Genitourinary:  Negative for dysuria.  Musculoskeletal:  Negative for arthralgias.  Neurological:  Negative for syncope, light-headedness and headaches.  Psychiatric/Behavioral:  Negative for dysphoric mood.   Objective:  BP 118/90   Pulse 69   Temp 98.1 F (36.7 C) (Temporal)   Ht 5' 11.5" (1.816 m)   Wt 213 lb 8 oz (96.8 kg)   SpO2 94%   BMI 29.36 kg/m   Wt Readings from Last 3 Encounters:  06/08/21 213 lb 8 oz (96.8 kg)  01/09/21 207 lb 3 oz (94 kg)  07/31/20 210 lb 6.4 oz (95.4 kg)      Physical Exam Constitutional:  Appearance: He is well-developed.  HENT:     Head: Normocephalic.     Right Ear: Hearing normal.     Left Ear: Hearing normal.     Nose: Nose normal.  Neck:     Thyroid: No thyroid mass or thyromegaly.     Vascular: No carotid bruit.     Trachea: Trachea normal.  Cardiovascular:     Rate and Rhythm: Normal rate and regular rhythm.     Pulses: Normal pulses.     Heart sounds: Heart sounds not distant. No murmur heard.   No friction rub. No gallop.     Comments: No peripheral edema Pulmonary:     Effort: Pulmonary effort is normal. No respiratory distress.     Breath sounds: Normal breath sounds.  Skin:    General: Skin is warm and dry.     Findings: No rash.  Psychiatric:        Speech: Speech normal.        Behavior:  Behavior normal.        Thought Content: Thought content normal.      Results for orders placed or performed in visit on 07/31/20  POCT URINALYSIS DIP (CLINITEK)  Result Value Ref Range   Color, UA yellow yellow   Clarity, UA clear clear   Glucose, UA negative negative mg/dL   Bilirubin, UA negative negative   Ketones, POC UA negative negative mg/dL   Spec Grav, UA 2.947 6.546 - 1.025   Blood, UA negative negative   pH, UA 7.5 5.0 - 8.0   POC PROTEIN,UA negative negative, trace   Urobilinogen, UA 0.2 0.2 or 1.0 E.U./dL   Nitrite, UA Negative Negative   Leukocytes, UA Negative Negative    This visit occurred during the SARS-CoV-2 public health emergency.  Safety protocols were in place, including screening questions prior to the visit, additional usage of staff PPE, and extensive cleaning of exam room while observing appropriate contact time as indicated for disinfecting solutions.   COVID 19 screen:  No recent travel or known exposure to COVID19 The patient denies respiratory symptoms of COVID 19 at this time. The importance of social distancing was discussed today.   Assessment and Plan Problem List Items Addressed This Visit     Benign essential hypertension - Primary     Decrease caffeine and salt. Encouraged exercise, weight loss, healthy eating habits.  start low dose losartan and call with measurements in 2 weeks.       Relevant Medications   Aspirin 81 MG CAPS   losartan (COZAAR) 25 MG tablet    Batten's disease in wifes family, planning on children.. requests genetic testing.. I will look into this.    Kerby Nora, MD

## 2021-06-08 NOTE — Patient Instructions (Addendum)
Get back on track with low salt diet, decrease caffeine. Work on weight loss. Start low dose 25 mg daily losartan daily. Follow BP at home... call/MyChart  BPs in next  2 weeks.

## 2021-06-08 NOTE — Assessment & Plan Note (Signed)
Decrease caffeine and salt. Encouraged exercise, weight loss, healthy eating habits.  start low dose losartan and call with measurements in 2 weeks.

## 2021-06-19 ENCOUNTER — Other Ambulatory Visit: Payer: Self-pay | Admitting: Family Medicine

## 2021-06-19 DIAGNOSIS — M109 Gout, unspecified: Secondary | ICD-10-CM

## 2021-08-14 ENCOUNTER — Encounter (HOSPITAL_BASED_OUTPATIENT_CLINIC_OR_DEPARTMENT_OTHER): Payer: Self-pay | Admitting: *Deleted

## 2021-08-14 ENCOUNTER — Emergency Department (HOSPITAL_BASED_OUTPATIENT_CLINIC_OR_DEPARTMENT_OTHER)
Admission: EM | Admit: 2021-08-14 | Discharge: 2021-08-14 | Disposition: A | Discharge status: Home / Self Care | Payer: No Typology Code available for payment source | Attending: Emergency Medicine | Admitting: Emergency Medicine

## 2021-08-14 ENCOUNTER — Other Ambulatory Visit: Payer: Self-pay

## 2021-08-14 DIAGNOSIS — S61211A Laceration without foreign body of left index finger without damage to nail, initial encounter: Secondary | ICD-10-CM | POA: Insufficient documentation

## 2021-08-14 DIAGNOSIS — Z7982 Long term (current) use of aspirin: Secondary | ICD-10-CM | POA: Diagnosis not present

## 2021-08-14 DIAGNOSIS — S61311A Laceration without foreign body of left index finger with damage to nail, initial encounter: Secondary | ICD-10-CM

## 2021-08-14 DIAGNOSIS — Y93G3 Activity, cooking and baking: Secondary | ICD-10-CM | POA: Diagnosis not present

## 2021-08-14 DIAGNOSIS — Z87891 Personal history of nicotine dependence: Secondary | ICD-10-CM | POA: Insufficient documentation

## 2021-08-14 DIAGNOSIS — I10 Essential (primary) hypertension: Secondary | ICD-10-CM | POA: Diagnosis not present

## 2021-08-14 DIAGNOSIS — Z79899 Other long term (current) drug therapy: Secondary | ICD-10-CM | POA: Diagnosis not present

## 2021-08-14 DIAGNOSIS — W260XXA Contact with knife, initial encounter: Secondary | ICD-10-CM | POA: Insufficient documentation

## 2021-08-14 DIAGNOSIS — S6992XA Unspecified injury of left wrist, hand and finger(s), initial encounter: Secondary | ICD-10-CM | POA: Diagnosis present

## 2021-08-14 NOTE — ED Triage Notes (Signed)
Pt was cutting up cabbage today, cut left index finger at nailbed. Bleeding controlled.

## 2021-08-14 NOTE — ED Provider Notes (Signed)
[facility] EMERGENCY DEPT Provider Note   CSN: [id number] Arrival date & time: 08/14/21  1130     History Chief Complaint  Patient presents with   Finger Injury    Peter Fuller is a 40 y.o. male.  Patient presents with a laceration to his left index finger.  He was cutting some cabbage and the knife cut through the distal tip of his index finger.  His tetanus shot is up-to-date.  No other injuries.  He denies anything broke off.      Past Medical History:  Diagnosis Date   History of fracture of lower leg     Patient Active Problem List   Diagnosis Date Noted   Benign essential hypertension 09/06/2019   Gout 05/09/2018   Low serum testosterone level 02/25/2014   HSV-1 (herpes simplex virus 1) infection 02/21/2014    Past Surgical History:  Procedure Laterality Date   FRACTURE SURGERY  05/24/2005   tibial fracture       Family History  Problem Relation Age of Onset   Arthritis Mother    Hyperlipidemia Mother    Hypertension Mother    Hyperlipidemia Father    Hypertension Father    Arthritis Maternal Grandmother    Colon cancer Paternal Grandmother    Arthritis Paternal Grandmother     Social History   Tobacco Use   Smoking status: Former    Types: Cigarettes    Quit date: 08/26/2004    Years since quitting: 16.9   Smokeless tobacco: Never  Vaping Use   Vaping Use: Never used  Substance Use Topics   Alcohol use: Yes    Alcohol/week: 0.0 - 5.0 standard drinks    Comment: 0-5 drinks/week   Drug use: No    Home Medications Prior to Admission medications   Medication Sig Start Date End Date Taking? Authorizing Provider  Aspirin 81 MG CAPS Take 1 capsule by mouth daily.    [provider]  fluticasone (FLONASE) 50 MCG/ACT nasal spray Place 2 sprays into both nostrils daily as needed for allergies or rhinitis.    [provider]  indomethacin (INDOCIN) 50 MG capsule TAKE 1 CAPSULE BY MOUTH THREE TIMES A DAY AS NEEDED  06/20/21   Bedsole, Amy E, MD  losartan (COZAAR) 25 MG tablet Take 1 tablet (25 mg total) by mouth daily. 06/08/21   Excell Seltzer, MD  Multiple Vitamin (MULTIVITAMIN) tablet Take 1 tablet by mouth daily.    [provider]  testosterone cypionate (DEPOTESTOTERONE CYPIONATE) 100 MG/ML injection Inject 200 mg into the muscle every 14 (fourteen) days. For IM use only Patient not taking: Reported on 06/08/2021    [provider]  valACYclovir (VALTREX) 1000 MG tablet TAKE 1 TABLET BY MOUTH TWICE A DAY Patient taking differently: Take 1,000 mg by mouth as needed. 04/14/20   Excell Seltzer, MD    Allergies    Patient has no known allergies.  Review of Systems   Review of Systems  Constitutional:  Negative for fever.  Gastrointestinal:  Negative for nausea and vomiting.  Musculoskeletal:  Negative for arthralgias, back pain, joint swelling and neck pain.  Skin:  Positive for wound.  Neurological:  Negative for weakness, numbness and headaches.   Physical Exam Updated Vital Signs BP (!) 155/102 (BP Location: Right Arm)   Pulse 67   Temp 98.2 F (36.8 C)   Resp 16   Ht 6' (1.829 m)   Wt 94.3 kg   SpO2 99%   BMI  28.21 kg/m   Physical Exam Constitutional:      Appearance: He is well-developed.  HENT:     Head: Normocephalic and atraumatic.  Cardiovascular:     Rate and Rhythm: Normal rate.  Pulmonary:     Effort: Pulmonary effort is normal.  Musculoskeletal:        General: No tenderness.     Cervical back: Normal range of motion and neck supple.     Comments: Small laceration adjacent to the nail to the distal tip of the left index finger.  There is a small separation in the nail.  No subungual hematoma.  No bony tenderness.  No active bleeding.  Neurovascular intact distally.  Skin:    General: Skin is warm and dry.  Neurological:     Mental Status: He is alert and oriented to person, place, and time.     ED Results / Procedures / Treatments   Labs (all  labs ordered are listed, but only abnormal results are displayed) Labs Reviewed - No data to display  EKG None  Radiology No results found.  Procedures Procedures   Medications Ordered in ED Medications - No data to display  ED Course  I have reviewed the triage vital signs and the nursing notes.  Pertinent labs & imaging results that were available during my care of the patient were reviewed by me and considered in my medical decision making (see chart for details).    MDM Rules/Calculators/A&P                           Patient with a superficial laceration to the distal tip of the left index finger.  It did cut through the edge of his nail.  No significant subungual hematoma is noted.  No bony tenderness or concern for foreign body that would warrant imaging.  There is nothing to suture.  I did advise him that there is a dusky edge to the laceration that may slough off.  He was given nail bed injury precautions.  Return precautions and wound care instructions were given. Final Clinical Impression(s) / ED Diagnoses Final diagnoses:  Laceration of left index finger without foreign body with damage to nail, initial encounter    Rx / DC Orders ED Discharge Orders     None        Rolan Bucco, MD 08/14/21 1217

## 2022-04-08 ENCOUNTER — Encounter: Payer: Self-pay | Admitting: Family Medicine

## 2022-04-08 ENCOUNTER — Telehealth: Payer: Self-pay | Admitting: Family Medicine

## 2022-04-08 ENCOUNTER — Ambulatory Visit: Payer: 59 | Admitting: Family Medicine

## 2022-04-08 VITALS — BP 131/85 | HR 64 | Temp 98.2°F | Resp 18 | Ht 72.0 in | Wt 223.4 lb

## 2022-04-08 DIAGNOSIS — M109 Gout, unspecified: Secondary | ICD-10-CM

## 2022-04-08 DIAGNOSIS — B009 Herpesviral infection, unspecified: Secondary | ICD-10-CM

## 2022-04-08 DIAGNOSIS — M1A9XX0 Chronic gout, unspecified, without tophus (tophi): Secondary | ICD-10-CM | POA: Diagnosis not present

## 2022-04-08 MED ORDER — INDOMETHACIN 50 MG PO CAPS: ORAL_CAPSULE | ORAL | 1 refills | Status: DC

## 2022-04-08 MED ORDER — VALACYCLOVIR HCL 1 G PO TABS: ORAL_TABLET | ORAL | 1 refills | Status: AC

## 2022-04-08 NOTE — Progress Notes (Signed)
? ?Subjective:  ? ? Patient ID: Peter Fuller, male    DOB: 1981-03-29, 41 y.o.   MRN: [id number] ? ?HPI ?41 yo pt of Dr Ermalene Searing presents for possible gout  ?Wt Readings from Last 3 Encounters:  ?04/08/22 223 lb 6.4 oz (101.3 kg)  ?08/14/21 208 lb (94.3 kg)  ?06/08/21 213 lb 8 oz (96.8 kg)  ? ?30.30 kg/m? ? ?L foot pain and swelling  ?2 times in the past several months  ? ?Has had gout in past  ?Ate salmon and it became worse -this past week  ? ?Realized he had 4 pills of indomethacin left last night  ?Helps  ? ?Started in big toe ?Crossed over laterally  ?Red and swollen ?Warm and tender  ? ? ?Drinks water- needs to do better  ? ?Recently thought he injured himself  ?Had neg w/u at [facility]  ? ?H/o gout  ?Lab Results  ?Component Value Date  ? LABURIC 8.1 (H) 07/24/2020  ? ?Was not interested in allopurinol -now may be  ?Has taken indocin in the past  ? ?BP Readings from Last 3 Encounters:  ?04/08/22 131/85  ?08/14/21 (!) 155/102  ?06/08/21 118/90  ? ? ?Pulse Readings from Last 3 Encounters:  ?04/08/22 64  ?08/14/21 67  ?06/08/21 69  ? ?Diet ?Alcohol intake  ? ?Lab Results  ?Component Value Date  ? CREATININE 1.08 07/24/2020  ? BUN 18 07/24/2020  ? NA 139 07/24/2020  ? K 3.9 07/24/2020  ? CL 103 07/24/2020  ? CO2 29 07/24/2020  ?  ?Patient Active Problem List  ? Diagnosis Date Noted  ? Benign essential hypertension 09/06/2019  ? Gout 05/09/2018  ? Low serum testosterone level 02/25/2014  ? HSV-1 (herpes simplex virus 1) infection 02/21/2014  ? ?Past Medical History:  ?Diagnosis Date  ? History of fracture of lower leg   ? ?Past Surgical History:  ?Procedure Laterality Date  ? FRACTURE SURGERY  05/24/2005  ? tibial fracture  ? ?Social History  ? ?Tobacco Use  ? Smoking status: Former  ?  Types: Cigarettes  ?  Quit date: 08/26/2004  ?  Years since quitting: 17.6  ? Smokeless tobacco: Never  ?Vaping Use  ? Vaping Use: Never used  ?Substance Use Topics  ? Alcohol use: Yes  ?  Alcohol/week: 0.0 - 5.0 standard drinks  ?   Comment: 0-5 drinks/week  ? Drug use: No  ? ?Family History  ?Problem Relation Age of Onset  ? Arthritis Mother   ? Hyperlipidemia Mother   ? Hypertension Mother   ? Hyperlipidemia Father   ? Hypertension Father   ? Arthritis Maternal Grandmother   ? Colon cancer Paternal Grandmother   ? Arthritis Paternal Grandmother   ? ?No Known Allergies ?Current Outpatient Medications on File Prior to Visit  ?Medication Sig Dispense Refill  ? Aspirin 81 MG CAPS Take 1 capsule by mouth daily.    ? fluticasone (FLONASE) 50 MCG/ACT nasal spray Place 2 sprays into both nostrils daily as needed for allergies or rhinitis.    ? losartan (COZAAR) 25 MG tablet Take 1 tablet (25 mg total) by mouth daily. 30 tablet 11  ? Multiple Vitamin (MULTIVITAMIN) tablet Take 1 tablet by mouth daily.    ? testosterone cypionate (DEPOTESTOTERONE CYPIONATE) 100 MG/ML injection Inject 200 mg into the muscle every 14 (fourteen) days. For IM use only    ? ?No current facility-administered medications on file prior to visit.  ?  ? ?Review of Systems  ?Constitutional:  Negative for activity change, appetite change, fatigue, fever and unexpected weight change.  ?HENT:  Negative for congestion, rhinorrhea, sore throat and trouble swallowing.   ?Eyes:  Negative for pain, redness, itching and visual disturbance.  ?Respiratory:  Negative for cough, chest tightness, shortness of breath and wheezing.   ?Cardiovascular:  Negative for chest pain and palpitations.  ?Gastrointestinal:  Negative for abdominal pain, blood in stool, constipation, diarrhea and nausea.  ?Endocrine: Negative for cold intolerance, heat intolerance, polydipsia and polyuria.  ?Genitourinary:  Negative for difficulty urinating, dysuria, frequency and urgency.  ?Musculoskeletal:  Positive for arthralgias. Negative for joint swelling and myalgias.  ?Skin:  Negative for pallor and rash.  ?Neurological:  Negative for dizziness, tremors, weakness, numbness and headaches.  ?Hematological:  Negative  for adenopathy. Does not bruise/bleed easily.  ?Psychiatric/Behavioral:  Negative for decreased concentration and dysphoric mood. The patient is not nervous/anxious.   ? ?   ?Objective:  ? Physical Exam ?Constitutional:   ?   General: He is not in acute distress. ?   Appearance: Normal appearance. He is obese. He is not ill-appearing or diaphoretic.  ?HENT:  ?   Mouth/Throat:  ?   Mouth: Mucous membranes are moist.  ?Eyes:  ?   General: No scleral icterus. ?   Conjunctiva/sclera: Conjunctivae normal.  ?   Pupils: Pupils are equal, round, and reactive to light.  ?Cardiovascular:  ?   Rate and Rhythm: Normal rate and regular rhythm.  ?   Pulses: Normal pulses.  ?   Heart sounds: Normal heart sounds.  ?Pulmonary:  ?   Effort: Pulmonary effort is normal. No respiratory distress.  ?   Breath sounds: Normal breath sounds. No wheezing.  ?Musculoskeletal:  ?   Cervical back: Neck supple.  ?   Comments: Left lateral midfoot is erythematous and swollen  ?Tender at 5th MTP joint  ? ?Ankle appears nl  ? ?Nl perfusion and rom of LE  ?Favors R leg with gait due to discomfort but can bear wt on the L  ? ?  ?Lymphadenopathy:  ?   Cervical: No cervical adenopathy.  ?Skin: ?   General: Skin is warm.  ?   Findings: Erythema present.  ?Neurological:  ?   Mental Status: He is alert.  ?   Sensory: No sensory deficit.  ?   Motor: No weakness.  ?Psychiatric:     ?   Mood and Affect: Mood normal.  ? ? ? ? ? ?   ?Assessment & Plan:  ? ?Problem List Items Addressed This Visit   ? ?  ? Other  ? Gout - Primary  ?  Pain in L lateral foot (originally in great toe) with h/o gout  ?Has had recent xr at orthopedics  ?Refilled indomethacin/ use caution re: GI upset  ?Pt wants to consider preventative tx/ allopurinol or other  ?Lab today for cmet and uric acid  ?Disc imp of fluids ?Given handout re: low purine diet and gout  ?Elevate/warm compress if needed  ?Update if not starting to improve in a week or if worsening   ? ?  ?  ? Relevant Orders  ?  Comprehensive metabolic panel (Completed)  ? Uric acid (Completed)  ? HSV-1 (herpes simplex virus 1) infection  ?  Pt asked for refill of valtrex to use 2000 mg bid for one day for cold sore prn ? ?  ?  ? Relevant Medications  ? valACYclovir (VALTREX) 1000 MG tablet  ? ?Other Visit Diagnoses   ? ?  Gout of big toe      ? Relevant Medications  ? indomethacin (INDOCIN) 50 MG capsule  ? Herpesviral infection, unspecified      ? Relevant Medications  ? valACYclovir (VALTREX) 1000 MG tablet  ? ?  ? ? ? ? ?

## 2022-04-08 NOTE — Patient Instructions (Signed)
Drink lots of fluids and eat a low purine diet  ? ?Labs today  ? ?Take the indomethacin with food  ?If not tolerated let us know  ? ?Update if not starting to improve in a week or if worsening   ? ?Elevate your foot when you can  ? ? ?

## 2022-04-09 LAB — COMPREHENSIVE METABOLIC PANEL
AG Ratio: 2 (calc) (ref 1.0–2.5)
ALT: 33 U/L (ref 9–46)
AST: 18 U/L (ref 10–40)
Albumin: 4.5 g/dL (ref 3.6–5.1)
Alkaline phosphatase (APISO): 73 U/L (ref 36–130)
BUN: 15 mg/dL (ref 7–25)
CO2: 27 mmol/L (ref 20–32)
Calcium: 9.4 mg/dL (ref 8.6–10.3)
Chloride: 105 mmol/L (ref 98–110)
Creat: 1.03 mg/dL (ref 0.60–1.29)
Globulin: 2.3 g/dL (calc) (ref 1.9–3.7)
Glucose, Bld: 94 mg/dL (ref 65–99)
Potassium: 4 mmol/L (ref 3.5–5.3)
Sodium: 141 mmol/L (ref 135–146)
Total Bilirubin: 0.4 mg/dL (ref 0.2–1.2)
Total Protein: 6.8 g/dL (ref 6.1–8.1)

## 2022-04-09 LAB — URIC ACID: Uric Acid, Serum: 7.7 mg/dL (ref 4.0–8.0)

## 2022-04-10 NOTE — Assessment & Plan Note (Signed)
Pain in L lateral foot (originally in great toe) with h/o gout  ?Has had recent xr at orthopedics  ?Refilled indomethacin/ use caution re: GI upset  ?Pt wants to consider preventative tx/ allopurinol or other  ?Lab today for cmet and uric acid  ?Disc imp of fluids ?Given handout re: low purine diet and gout  ?Elevate/warm compress if needed  ?Update if not starting to improve in a week or if worsening   ?

## 2022-04-10 NOTE — Assessment & Plan Note (Signed)
Pt asked for refill of valtrex to use 2000 mg bid for one day for cold sore prn ?

## 2022-06-07 ENCOUNTER — Telehealth: Payer: Self-pay

## 2022-06-07 ENCOUNTER — Ambulatory Visit: Payer: 59 | Admitting: Nurse Practitioner

## 2022-06-07 ENCOUNTER — Encounter: Payer: Self-pay | Admitting: Nurse Practitioner

## 2022-06-07 VITALS — BP 140/88 | HR 80 | Temp 97.2°F | Resp 14 | Ht 72.0 in | Wt 214.0 lb

## 2022-06-07 DIAGNOSIS — J069 Acute upper respiratory infection, unspecified: Secondary | ICD-10-CM | POA: Diagnosis not present

## 2022-06-07 DIAGNOSIS — R051 Acute cough: Secondary | ICD-10-CM | POA: Insufficient documentation

## 2022-06-07 MED ORDER — GUAIFENESIN-CODEINE 100-10 MG/5ML PO SOLN: 5.0000 mL | Freq: Three times a day (TID) | ORAL | 0 refills | Status: AC | PRN

## 2022-06-07 NOTE — Assessment & Plan Note (Signed)
Patient is having difficulty with cough and sleep we will prescribe codeine guaifenesin cough medication 5 mL 3 times daily as needed cough.  Sedation precautions reviewed

## 2022-06-07 NOTE — Telephone Encounter (Signed)
Noted will evaluate in office 

## 2022-06-07 NOTE — Telephone Encounter (Signed)
per orders appt notes pt already has appt with Audria Nine NP 06/07/22 at 11 AM. Sending note to Audria Nine NP and Anastasiya CMA.

## 2022-06-24 ENCOUNTER — Encounter: Payer: 59 | Admitting: Family Medicine

## 2022-06-28 ENCOUNTER — Other Ambulatory Visit: Payer: Self-pay

## 2022-06-28 ENCOUNTER — Emergency Department: Payer: 59

## 2022-06-28 ENCOUNTER — Emergency Department
Admission: EM | Admit: 2022-06-28 | Discharge: 2022-06-28 | Disposition: A | Discharge status: Home / Self Care | Payer: 59 | Attending: Emergency Medicine | Admitting: Emergency Medicine

## 2022-06-28 DIAGNOSIS — R42 Dizziness and giddiness: Secondary | ICD-10-CM | POA: Diagnosis not present

## 2022-06-28 DIAGNOSIS — I10 Essential (primary) hypertension: Secondary | ICD-10-CM | POA: Insufficient documentation

## 2022-06-28 DIAGNOSIS — R079 Chest pain, unspecified: Secondary | ICD-10-CM | POA: Insufficient documentation

## 2022-06-28 LAB — CBC
HCT: 44.5 % (ref 39.0–52.0)
Hemoglobin: 14.9 g/dL (ref 13.0–17.0)
MCH: 29.3 pg (ref 26.0–34.0)
MCHC: 33.5 g/dL (ref 30.0–36.0)
MCV: 87.6 fL (ref 80.0–100.0)
Platelets: 219 10*3/uL (ref 150–400)
RBC: 5.08 MIL/uL (ref 4.22–5.81)
RDW: 13.1 % (ref 11.5–15.5)
WBC: 5.8 10*3/uL (ref 4.0–10.5)
nRBC: 0 % (ref 0.0–0.2)

## 2022-06-28 LAB — TROPONIN I (HIGH SENSITIVITY): Troponin I (High Sensitivity): 3 ng/L (ref <18)

## 2022-06-28 LAB — BASIC METABOLIC PANEL
Anion gap: 4 — ABNORMAL LOW (ref 5–15)
BUN: 13 mg/dL (ref 6–20)
CO2: 29 mmol/L (ref 22–32)
Calcium: 9.4 mg/dL (ref 8.9–10.3)
Chloride: 107 mmol/L (ref 98–111)
Creatinine, Ser: 0.94 mg/dL (ref 0.61–1.24)
GFR, Estimated: >60 mL/min (ref >60)
Glucose, Bld: 117 mg/dL — ABNORMAL HIGH (ref 70–99)
Potassium: 4.5 mmol/L (ref 3.5–5.1)
Sodium: 140 mmol/L (ref 135–145)

## 2022-06-28 NOTE — ED Provider Notes (Signed)
[facility] Provider Note    Event Date/Time   First MD Initiated Contact with Patient 06/28/22 1253     (approximate)   History   Chest Pain   HPI  Peter Fuller is a 41 y.o. male with a history of gout who presents for evaluation of high blood pressure and some lightheadedness and chest pressure he experienced earlier today.  He states started around 930 when he started feeling lightheaded and some pressure.  He checked his blood pressure and systolic was over 200.  He works in [employer] and he also checked his blood sugar and this was normal.  He reports he is feeling much better now.  He notes he has a history of high blood pressure but is not currently taking blood pressure medicines as he was able to be weaned off these.  States he has no chest pressure at this time.  Did not have any associated back pain, headache earache, sore throat, nausea, vomiting, diarrhea, urinary symptoms, abdominal pain, rash or any focal extremity weakness numbness or tingling.  States he had a similar episode about 13 months ago and was briefly placed on high blood pressure medicines but has not taken these in 6 months.  Denies tobacco abuse or significant EtOH use.  States he felt his normal state of health this morning but yesterday took a dose of indomethacin for some gout in his toes.  His currently has no pain in his toes.    Past Medical History:  Diagnosis Date   History of fracture of lower leg      Physical Exam  Triage Vital Signs: ED Triage Vitals  Enc Vitals Group     BP 06/28/22 1117 (!) 171/95     Pulse Rate 06/28/22 1117 (!) 52     Resp 06/28/22 1117 18     Temp 06/28/22 1117 97.9 F (36.6 C)     Temp Source 06/28/22 1117 Oral     SpO2 06/28/22 1117 100 %     Weight 06/28/22 1113 214 lb 1.1 oz (97.1 kg)     Height 06/28/22 1113 6' (1.829 m)     Head Circumference --      Peak Flow --      Pain Score 06/28/22 1113 2     Pain Loc --       Pain Edu? --      Excl. in GC? --     Most recent vital signs: Vitals:   06/28/22 1117  BP: (!) 171/95  Pulse: (!) 52  Resp: 18  Temp: 97.9 F (36.6 C)  SpO2: 100%    General: Awake, no distress.  CV:  Good peripheral perfusion.  2+ radial pulses.  Slightly bradycardic.  No significant murmur. Resp:  Normal effort.  Clear bilaterally. Abd:  No distention.  Soft. Other:  Significant lower extremity edema.   ED Results / Procedures / Treatments  Labs (all labs ordered are listed, but only abnormal results are displayed) Labs Reviewed  BASIC METABOLIC PANEL - Abnormal; Notable for the following components:      Result Value   Glucose, Bld 117 (*)    Anion gap 4 (*)    All other components within normal limits  CBC  TROPONIN I (HIGH SENSITIVITY)  TROPONIN I (HIGH SENSITIVITY)     EKG  ECG is remarkable for sinus rhythm with a bradycardic rate at 58, normal axis, unremarkable intervals with nonspecific ST change in lead III without  other clear evidence of acute ischemia or significant arrhythmia.   RADIOLOGY Chest reviewed by myself shows no focal consoidation, effusion, edema, pneumothorax or other clear acute thoracic process. I also reviewed radiology interpretation and agree with findings described.    PROCEDURES:  Critical Care performed: No  Procedures    MEDICATIONS ORDERED IN ED: Medications - No data to display   IMPRESSION / MDM / ASSESSMENT AND PLAN / ED COURSE  I reviewed the triage vital signs and the nursing notes. Patient's presentation is most consistent with acute presentation with potential threat to life or bodily function.                               Differential diagnosis includes, but is not limited to lightheadedness and chest pressure related to hypertensive emergency, paroxysmal tachydysrhythmia, dehydration, electrolyte derangements, and kidney injury.  He describes it as a mild achiness that is since resolved and there was no  ripping or tearing sensation or associated back pain or any headache to suggest an SAH or dissection.  Patient is PERC negative and I would low suspicion for PE.  No history or exam features to suggest acute infectious process or recent trauma.  Patient is actually slightly bradycardic and overall clinical picture is not suggestive of a thyroid storm.   ECG is remarkable for sinus rhythm with a bradycardic rate at 58, normal axis, unremarkable intervals with nonspecific ST change in lead III without other clear evidence of acute ischemia or significant arrhythmia.  Opponent obtained greater than 3 hours after symptom onset at 1114 when symptoms began at 9:30 AM is not suggestive of ACS or myocarditis.   Chest reviewed by myself shows no focal consoidation, effusion, edema, pneumothorax or other clear acute thoracic process. I also reviewed radiology interpretation and agree with findings described.  CBC without leukocytosis or acute anemia.  BMP without significant electrolyte or metabolic derangements.  I cannot see the record of this patient reports an echocardiogram later this year that was reported read as normal.  Overall unclear etiology of patient's symptoms at this time although given he is currently asymptomatic with reassuring exam work-up I think he is stable for continued outpatient evaluation possibly including cardiac stress test and Holter monitoring.  We discussed this at length with the patient and he will follow-up with his PCP and discuss reinitiating antihypertensives.  Discussed returning for any new or worsening of symptoms.  Discharged in stable condition.  Strict return precautions advised and discussed.   FINAL CLINICAL IMPRESSION(S) / ED DIAGNOSES   Final diagnoses:  Chest pain, unspecified type  Hypertension, unspecified type     Rx / DC Orders   ED Discharge Orders     None        Note:  This document was prepared using Dragon voice recognition software and  may include unintentional dictation errors.   Gilles Chiquito, MD 06/28/22 1335

## 2022-06-28 NOTE — ED Triage Notes (Signed)
Pt here with chest pressure that started today. Pt states pressure is constant. Pt states it is more pressure than actual pain. Pt denies N/V.

## 2022-06-30 ENCOUNTER — Encounter: Payer: Self-pay | Admitting: Family Medicine

## 2022-07-20 ENCOUNTER — Other Ambulatory Visit (INDEPENDENT_AMBULATORY_CARE_PROVIDER_SITE_OTHER): Payer: 59

## 2022-07-20 DIAGNOSIS — M1A9XX0 Chronic gout, unspecified, without tophus (tophi): Secondary | ICD-10-CM

## 2022-07-20 DIAGNOSIS — Z13220 Encounter for screening for lipoid disorders: Secondary | ICD-10-CM

## 2022-07-20 DIAGNOSIS — Z131 Encounter for screening for diabetes mellitus: Secondary | ICD-10-CM

## 2022-07-20 DIAGNOSIS — Z79899 Other long term (current) drug therapy: Secondary | ICD-10-CM

## 2022-07-20 LAB — LDL CHOLESTEROL, DIRECT: Direct LDL: 90 mg/dL

## 2022-07-20 LAB — BASIC METABOLIC PANEL
BUN: 17 mg/dL (ref 6–23)
CO2: 29 mEq/L (ref 19–32)
Calcium: 9.2 mg/dL (ref 8.4–10.5)
Chloride: 101 mEq/L (ref 96–112)
Creatinine, Ser: 1 mg/dL (ref 0.40–1.50)
GFR: 92.41 mL/min (ref >60.00)
Glucose, Bld: 94 mg/dL (ref 70–99)
Potassium: 3.7 mEq/L (ref 3.5–5.1)
Sodium: 139 mEq/L (ref 135–145)

## 2022-07-20 LAB — CBC WITH DIFFERENTIAL/PLATELET
Basophils Absolute: 0 10*3/uL (ref 0.0–0.1)
Basophils Relative: 0.6 % (ref 0.0–3.0)
Eosinophils Absolute: 0.1 10*3/uL (ref 0.0–0.7)
Eosinophils Relative: 2.1 % (ref 0.0–5.0)
HCT: 44.2 % (ref 39.0–52.0)
Hemoglobin: 14.9 g/dL (ref 13.0–17.0)
Lymphocytes Relative: 24 % (ref 12.0–46.0)
Lymphs Abs: 1.6 10*3/uL (ref 0.7–4.0)
MCHC: 33.6 g/dL (ref 30.0–36.0)
MCV: 88.7 fl (ref 78.0–100.0)
Monocytes Absolute: 0.6 10*3/uL (ref 0.1–1.0)
Monocytes Relative: 8.6 % (ref 3.0–12.0)
Neutro Abs: 4.3 10*3/uL (ref 1.4–7.7)
Neutrophils Relative %: 64.7 % (ref 43.0–77.0)
Platelets: 204 10*3/uL (ref 150.0–400.0)
RBC: 4.98 Mil/uL (ref 4.22–5.81)
RDW: 13.9 % (ref 11.5–15.5)
WBC: 6.7 10*3/uL (ref 4.0–10.5)

## 2022-07-20 LAB — LIPID PANEL
Cholesterol: 258 mg/dL — ABNORMAL HIGH (ref 0–200)
HDL: 34.5 mg/dL — ABNORMAL LOW (ref >39.00)
Total CHOL/HDL Ratio: 7
Triglycerides: 1031 mg/dL — ABNORMAL HIGH (ref 0.0–149.0)

## 2022-07-20 LAB — HEPATIC FUNCTION PANEL
ALT: 36 U/L (ref 0–53)
AST: 28 U/L (ref 0–37)
Albumin: 4.6 g/dL (ref 3.5–5.2)
Alkaline Phosphatase: 65 U/L (ref 39–117)
Bilirubin, Direct: 0.1 mg/dL (ref 0.0–0.3)
Total Bilirubin: 0.7 mg/dL (ref 0.2–1.2)
Total Protein: 7 g/dL (ref 6.0–8.3)

## 2022-07-20 LAB — HEMOGLOBIN A1C: Hgb A1c MFr Bld: 5.6 % (ref 4.6–6.5)

## 2022-07-20 LAB — URIC ACID: Uric Acid, Serum: 9.5 mg/dL — ABNORMAL HIGH (ref 4.0–7.8)

## 2022-07-20 NOTE — Progress Notes (Signed)
No critical labs need to be addressed urgently. We will discuss labs in detail at upcoming office visit.   

## 2022-07-26 ENCOUNTER — Ambulatory Visit (INDEPENDENT_AMBULATORY_CARE_PROVIDER_SITE_OTHER): Payer: 59 | Admitting: Family Medicine

## 2022-07-26 ENCOUNTER — Encounter: Payer: Self-pay | Admitting: Family Medicine

## 2022-07-26 VITALS — BP 110/84 | HR 86 | Ht 71.5 in | Wt 211.6 lb

## 2022-07-26 DIAGNOSIS — I10 Essential (primary) hypertension: Secondary | ICD-10-CM | POA: Diagnosis not present

## 2022-07-26 DIAGNOSIS — Z0000 Encounter for general adult medical examination without abnormal findings: Secondary | ICD-10-CM | POA: Diagnosis not present

## 2022-07-26 DIAGNOSIS — M1A0720 Idiopathic chronic gout, left ankle and foot, without tophus (tophi): Secondary | ICD-10-CM | POA: Diagnosis not present

## 2022-07-26 MED ORDER — ALLOPURINOL 100 MG PO TABS: 100.0000 mg | ORAL_TABLET | Freq: Every day | ORAL | 11 refills | Status: DC

## 2022-07-26 NOTE — Progress Notes (Signed)
Patient ID: Peter Fuller, male    DOB: February 01, 1981, 41 y.o.   MRN: [id number]  This visit was conducted in person.  BP 110/84   Pulse 86   Ht 5' 11.5" (1.816 m)   Wt 211 lb 9.6 oz (96 kg)   SpO2 97%   BMI 29.10 kg/m    CC:  Chief Complaint  Patient presents with   Annual Exam    Physical     Subjective:   HPI: Peter Kovacevic is a 41 y.o. male presenting on 07/26/2022 for Annual Exam (Physical )   Hypertension:   At goal on losartan 25 mg daily... Causes some cough BP Readings from Last 3 Encounters:  07/26/22 110/84  06/28/22 (!) 138/99  06/07/22 140/88  Using medication without problems or lightheadedness:  none Chest pain with exertion: none Edema: none Short of breath: none Average home BPs: 129/79 Other issues:  Wt Readings from Last 3 Encounters:  07/26/22 211 lb 9.6 oz (96 kg)  06/28/22 214 lb 1.1 oz (97.1 kg)  06/07/22 214 lb (97.1 kg)  Body mass index is 29.1 kg/m.    Gout  Using indomethacin for flares... in last 6 months more  2-3 flares in last 6 month.  He has made diet  change.. no beer, less salmon, less red meats. Lab Results  Component Value Date   LABURIC 9.5 (H) 07/20/2022   Off testosterone.     Relevant past medical, surgical, family and social history reviewed and updated as indicated. Interim medical history since our last visit reviewed. Allergies and medications reviewed and updated. Outpatient Medications Prior to Visit  Medication Sig Dispense Refill   Aspirin 81 MG CAPS Take 1 capsule by mouth daily.     fluticasone (FLONASE) 50 MCG/ACT nasal spray Place 2 sprays into both nostrils daily as needed for allergies or rhinitis.     indomethacin (INDOCIN) 50 MG capsule TAKE 1 CAPSULE BY MOUTH THREE TIMES A DAY AS NEEDED for gout with food 20 capsule 1   losartan (COZAAR) 25 MG tablet Take 1 tablet (25 mg total) by mouth daily. 30 tablet 11   Multiple Vitamin (MULTIVITAMIN) tablet Take 1 tablet by mouth daily.     testosterone cypionate  (DEPOTESTOTERONE CYPIONATE) 100 MG/ML injection Inject 200 mg into the muscle every 14 (fourteen) days. For IM use only     valACYclovir (VALTREX) 1000 MG tablet Take 2 pills by mouth twice daily for 1 day for cold sore 20 tablet 1   No facility-administered medications prior to visit.     Per HPI unless specifically indicated in ROS section below Review of Systems  Constitutional:  Negative for fatigue and fever.  HENT:  Negative for ear pain.   Eyes:  Negative for pain.  Respiratory:  Negative for cough and shortness of breath.   Cardiovascular:  Negative for chest pain, palpitations and leg swelling.  Gastrointestinal:  Negative for abdominal pain.  Genitourinary:  Negative for dysuria.  Musculoskeletal:  Negative for arthralgias.  Neurological:  Negative for syncope, light-headedness and headaches.  Psychiatric/Behavioral:  Negative for dysphoric mood.    Objective:  BP 110/84   Pulse 86   Ht 5' 11.5" (1.816 m)   Wt 211 lb 9.6 oz (96 kg)   SpO2 97%   BMI 29.10 kg/m   Wt Readings from Last 3 Encounters:  07/26/22 211 lb 9.6 oz (96 kg)  06/28/22 214 lb 1.1 oz (97.1 kg)  06/07/22 214 lb (97.1 kg)  Physical Exam Constitutional:      General: He is not in acute distress.    Appearance: Normal appearance. He is well-developed. He is not ill-appearing or toxic-appearing.  HENT:     Head: Normocephalic and atraumatic.     Right Ear: Hearing, tympanic membrane, ear canal and external ear normal.     Left Ear: Hearing, tympanic membrane, ear canal and external ear normal.     Nose: Nose normal.     Mouth/Throat:     Pharynx: Uvula midline.  Eyes:     General: Lids are normal. Lids are everted, no foreign bodies appreciated.     Conjunctiva/sclera: Conjunctivae normal.     Pupils: Pupils are equal, round, and reactive to light.  Neck:     Thyroid: No thyroid mass or thyromegaly.     Vascular: No carotid bruit.     Trachea: Trachea and phonation normal.   Cardiovascular:     Rate and Rhythm: Normal rate and regular rhythm.     Pulses: Normal pulses.     Heart sounds: S1 normal and S2 normal. Heart sounds not distant. No murmur heard.    No friction rub. No gallop.     Comments: No peripheral edema Pulmonary:     Effort: Pulmonary effort is normal. No respiratory distress.     Breath sounds: Normal breath sounds. No wheezing, rhonchi or rales.  Abdominal:     General: Bowel sounds are normal.     Palpations: Abdomen is soft.     Tenderness: There is no abdominal tenderness. There is no guarding or rebound.     Hernia: No hernia is present.  Musculoskeletal:     Cervical back: Normal range of motion and neck supple.  Lymphadenopathy:     Cervical: No cervical adenopathy.  Skin:    General: Skin is warm and dry.     Findings: No rash.  Neurological:     Mental Status: He is alert.     Cranial Nerves: No cranial nerve deficit.     Sensory: No sensory deficit.     Gait: Gait normal.     Deep Tendon Reflexes: Reflexes are normal and symmetric.  Psychiatric:        Speech: Speech normal.        Behavior: Behavior normal.        Thought Content: Thought content normal.        Judgment: Judgment normal.       Results for orders placed or performed in visit on 07/20/22  Lipid panel  Result Value Ref Range   Cholesterol 258 (H) 0 - 200 mg/dL   Triglycerides (H) 0.0 - 149.0 mg/dL    4431.5 Triglyceride is over 400; calculations on Lipids are invalid.   HDL 34.50 (L) >39.00 mg/dL   Total CHOL/HDL Ratio 7   Hepatic function panel  Result Value Ref Range   Total Bilirubin 0.7 0.2 - 1.2 mg/dL   Bilirubin, Direct 0.1 0.0 - 0.3 mg/dL   Alkaline Phosphatase 65 39 - 117 U/L   AST 28 0 - 37 U/L   ALT 36 0 - 53 U/L   Total Protein 7.0 6.0 - 8.3 g/dL   Albumin 4.6 3.5 - 5.2 g/dL  Basic metabolic panel  Result Value Ref Range   Sodium 139 135 - 145 mEq/L   Potassium 3.7 3.5 - 5.1 mEq/L   Chloride 101 96 - 112 mEq/L   CO2 29 19 - 32  mEq/L   Glucose, Bld  94 70 - 99 mg/dL   BUN 17 6 - 23 mg/dL   Creatinine, Ser 0.14 0.40 - 1.50 mg/dL   GFR 10.30 >13.14 mL/min   Calcium 9.2 8.4 - 10.5 mg/dL  CBC with Differential/Platelet  Result Value Ref Range   WBC 6.7 4.0 - 10.5 K/uL   RBC 4.98 4.22 - 5.81 Mil/uL   Hemoglobin 14.9 13.0 - 17.0 g/dL   HCT 38.8 87.5 - 79.7 %   MCV 88.7 78.0 - 100.0 fl   MCHC 33.6 30.0 - 36.0 g/dL   RDW 28.2 06.0 - 15.6 %   Platelets 204.0 150.0 - 400.0 K/uL   Neutrophils Relative % 64.7 43.0 - 77.0 %   Lymphocytes Relative 24.0 12.0 - 46.0 %   Monocytes Relative 8.6 3.0 - 12.0 %   Eosinophils Relative 2.1 0.0 - 5.0 %   Basophils Relative 0.6 0.0 - 3.0 %   Neutro Abs 4.3 1.4 - 7.7 K/uL   Lymphs Abs 1.6 0.7 - 4.0 K/uL   Monocytes Absolute 0.6 0.1 - 1.0 K/uL   Eosinophils Absolute 0.1 0.0 - 0.7 K/uL   Basophils Absolute 0.0 0.0 - 0.1 K/uL  Hemoglobin A1c  Result Value Ref Range   Hgb A1c MFr Bld 5.6 4.6 - 6.5 %  Uric acid  Result Value Ref Range   Uric Acid, Serum 9.5 (H) 4.0 - 7.8 mg/dL  LDL cholesterol, direct  Result Value Ref Range   Direct LDL 90.0 mg/dL     COVID 19 screen:  No recent travel or known exposure to COVID19 The patient denies respiratory symptoms of COVID 19 at this time. The importance of social distancing was discussed today.   Assessment and Plan   The patient's preventative maintenance and recommended screening tests for an annual wellness exam were reviewed in full today. Brought up to date unless services declined.  Counselled on the importance of diet, exercise, and its role in overall health and mortality. The patient's FH and SH was reviewed, including their home life, tobacco status, and drug and alcohol status.   Vaccines:  Discussed COVID19 vaccine side effects and benefits. Strongly encouraged the patient to get the vaccine. Questions answered. Tdap 2016 STD testing: refused. Remote smoker: 2 ppd QUIT 2005.  No early family history of prostate  cancer or  PGM with colon cancer. PSA at work stable Hep C :  Negative hep C test 4 years ago   Problem List Items Addressed This Visit     Benign essential hypertension    Stable, chronic.  Continue current medication.   Losartan 25 mg p.o. daily      Gout    Chronic, given increase in flares we will start allopurinol for prevention.  allopurinoll 100 mg p.o. daily Continue low uric acid diet. Return in 4 to 6 weeks for uric acid check.    Use indomethacin as treatment.      Relevant Orders   Uric acid   Other Visit Diagnoses     Routine general medical examination at a health care facility    -  Primary      Meds ordered this encounter  Medications   allopurinol (ZYLOPRIM) 100 MG tablet    Sig: Take 1 tablet (100 mg total) by mouth daily.    Dispense:  30 tablet    Refill:  11   Orders Placed This Encounter  Procedures   Uric acid    Standing Status:   Future    Standing Expiration Date:  07/26/2023     Kerby Nora, MD

## 2022-07-26 NOTE — Patient Instructions (Addendum)
Work on low purine diet.  Start allopurinol 100 mg daily.  Return in 4-6 week for uric acid level.  Work on low fat, low cholesterol diet... can do at work here.

## 2022-08-07 ENCOUNTER — Other Ambulatory Visit: Payer: Self-pay | Admitting: Family Medicine

## 2022-08-08 NOTE — Assessment & Plan Note (Signed)
Stable, chronic.  Continue current medication.   Losartan 25 mg p.o. daily

## 2022-08-08 NOTE — Assessment & Plan Note (Signed)
Chronic, given increase in flares we will start allopurinol for prevention.  allopurinoll 100 mg p.o. daily Continue low uric acid diet. Return in 4 to 6 weeks for uric acid check.    Use indomethacin as treatment.

## 2022-08-31 ENCOUNTER — Other Ambulatory Visit (INDEPENDENT_AMBULATORY_CARE_PROVIDER_SITE_OTHER): Payer: 59

## 2022-08-31 DIAGNOSIS — M1A0720 Idiopathic chronic gout, left ankle and foot, without tophus (tophi): Secondary | ICD-10-CM | POA: Diagnosis not present

## 2022-08-31 LAB — URIC ACID: Uric Acid, Serum: 6.9 mg/dL (ref 4.0–7.8)

## 2022-11-11 ENCOUNTER — Telehealth: Payer: Self-pay | Admitting: Family Medicine

## 2022-11-11 DIAGNOSIS — M109 Gout, unspecified: Secondary | ICD-10-CM

## 2022-11-11 MED ORDER — INDOMETHACIN 50 MG PO CAPS: ORAL_CAPSULE | ORAL | 1 refills | Status: DC

## 2022-11-11 NOTE — Telephone Encounter (Signed)
Left message for [place] with below information from Dr. Ermalene Searing.  I ask that he call us back with any questions and to also schedule lab appointment 4 to 6 weeks after restarting his allopurinol.

## 2022-11-11 NOTE — Telephone Encounter (Signed)
[place] notified as instructed by telephone.  He states he did not take the allopurinol last night so he had only missed one dose.  He will start the indomethacin.  He is asking since he had a breakthrough flare should his allopurinol dose be increased.  Also when does he restart the allopurinol.  Please advise.   Patient did need refill on the indomethacin.  Refill sent to [facility] in [place].

## 2022-11-11 NOTE — Telephone Encounter (Signed)
Patient returned call,and verbalized that he understood the VM that was left for him.

## 2022-11-11 NOTE — Telephone Encounter (Signed)
Restart the allopurinol when his flare is completely resolved. Have him return after back on the allopurinol for 4 to 6 weeks for a repeat uric acid.  Make sure he is aware of low uric acid diet.  This can be sent to him on MyChart if needed.  If uric acid is above 7 at repeat testing we will increase the dose of his allopurinol.

## 2022-11-11 NOTE — Telephone Encounter (Signed)
Call  Patient has misunderstood.  Allopurinol should be continued throughout the flare but if he has now stopped it he should not restart it during a flare as it can make the flare worse. If he is still on allopurinol continue taking it without missing a dose. It is okay to take the indomethacin 50 mg 3 times daily as needed during the flare.  If symptoms are not improving, make an appointment to be seen

## 2022-11-11 NOTE — Telephone Encounter (Signed)
Left message for [place] to return my call.

## 2022-11-11 NOTE — Telephone Encounter (Signed)
Pt called stating he has a gout flare up & was told if he does, stop taking the meds, allopurinol (ZYLOPRIM) 100 MG tablet. Pt mentioned he has some pills remaining of indomethacin (INDOCIN) 50 MG capsule, he is asking is it okay to take? Call back # [phone]

## 2022-11-14 MED ORDER — PREDNISONE 20 MG PO TABS: ORAL_TABLET | ORAL | 0 refills | Status: DC

## 2022-11-14 NOTE — Telephone Encounter (Signed)
Spoke with Peter Fuller.  He states he is having another hypertension crisis.  He states this is the third time this has happen to him while taking the indomethacin.  His blood pressure currently in 170/100 and he feels lightheaded.    This happens about 30 to 45 minutes after he takes the medication.  Last dose was taken this morning.  Last time this happen was back in July and he was seen in ER.  They ran a lot of test and everything came back okay.   Will forward to Dr. Patsy Lager to review since Dr. Ermalene Searing is out of the office. Please advise.

## 2022-11-14 NOTE — Telephone Encounter (Signed)
[place] notified as instructed by telephone.  I have reached out to Dr. Ermalene Searing to see if she can also look at this message to make recommendation in regards to Isaic's gout treatment since he is having a painful flare.

## 2022-11-14 NOTE — Telephone Encounter (Signed)
Spoke with Peter Fuller.  He states his blood pressure was running normal before starting the Indocin. He is agreeable to prednisone taper for treatment of Gout Flare.  [facility] [place] as instructed by Dr. Ermalene Searing.  Medication list updated.

## 2022-11-14 NOTE — Telephone Encounter (Signed)
I would stop the indomethacin.  Dr. Leonard Schwartz may give additional direction when she reviews this message.

## 2022-11-14 NOTE — Addendum Note (Signed)
Addended by: Damita Lack on: 11/14/2022 12:29 PM   Modules accepted: Orders

## 2022-11-14 NOTE — Telephone Encounter (Signed)
Patient called and stated she would like a call back from Lupita Leash about his gout. Call back number [phone].

## 2022-11-15 NOTE — Telephone Encounter (Signed)
Noted  

## 2022-11-21 ENCOUNTER — Encounter: Payer: Self-pay | Admitting: Family Medicine

## 2022-11-22 ENCOUNTER — Ambulatory Visit (INDEPENDENT_AMBULATORY_CARE_PROVIDER_SITE_OTHER): Payer: 59 | Admitting: Family Medicine

## 2022-11-22 ENCOUNTER — Encounter: Payer: Self-pay | Admitting: Family Medicine

## 2022-11-22 VITALS — BP 130/74 | HR 88 | Temp 99.4°F | Ht 71.5 in | Wt 212.2 lb

## 2022-11-22 DIAGNOSIS — M109 Gout, unspecified: Secondary | ICD-10-CM | POA: Diagnosis not present

## 2022-11-22 MED ORDER — PREDNISONE 20 MG PO TABS: ORAL_TABLET | ORAL | 0 refills | Status: DC

## 2022-11-22 MED ORDER — METHYLPREDNISOLONE ACETATE 80 MG/ML IJ SUSP
80.0000 mg | Freq: Once | INTRAMUSCULAR | Status: AC
  Administered 2022-11-22: 80 mg via INTRAMUSCULAR

## 2022-11-22 MED ORDER — COLCHICINE 0.6 MG PO TABS: ORAL_TABLET | ORAL | 0 refills | Status: DC

## 2022-11-22 NOTE — Assessment & Plan Note (Signed)
Acute flare. Treat with DepoMedrol 80 mg IM x 1 followed by prednisone  40 mg daily until flare resolved.  Check uric acid.  For further flares recommended colchicine early in flare and not stopping allopurinol.   He  will continue low purine diet and restart allopurinol once flare in over.

## 2022-11-22 NOTE — Patient Instructions (Addendum)
Start prednisone tomorrow  40 mg daily x 1-2 weeks until flare resolved then wean off.  In future, for  early gout flare can try to use colchicine twice daily until resolved.  Continue working on low uric acid diet. Make  sure to restart  allopurinol only when flare totally resolved.

## 2022-11-22 NOTE — Progress Notes (Signed)
Patient ID: Peter Fuller, male    DOB: 06/03/1981, 41 y.o.   MRN: [id number]  This visit was conducted in person.  BP 130/74   Pulse 88   Temp 99.4 F (37.4 C) (Oral)   Ht 5' 11.5" (1.816 m)   Wt 212 lb 4 oz (96.3 kg)   SpO2 98%   BMI 29.19 kg/m    CC:  Chief Complaint  Patient presents with   Gout    Subjective:   HPI: Peter Clarin is a 41 y.o. male presenting on 11/22/2022 for Gout  New onset swelling in right great toe... this feels like gout he has had in past.  Started dec 2...  Indocin inititally hekped some but BP spiked... told to stop. Minimal improvement with prednisone taper.. started feeling better but then midway thorough 6 day taper... came back full force.  Severe pain limiting his ability to walk in right great toe.    BP was elevated with indocin. BP Readings from Last 3 Encounters:  11/22/22 130/74  07/26/22 110/84  06/28/22 (!) 138/99          Relevant past medical, surgical, family and social history reviewed and updated as indicated. Interim medical history since our last visit reviewed. Allergies and medications reviewed and updated. Outpatient Medications Prior to Visit  Medication Sig Dispense Refill   allopurinol (ZYLOPRIM) 100 MG tablet Take 1 tablet (100 mg total) by mouth daily. 30 tablet 11   Aspirin 81 MG CAPS Take 1 capsule by mouth daily.     fluticasone (FLONASE) 50 MCG/ACT nasal spray Place 2 sprays into both nostrils daily as needed for allergies or rhinitis.     losartan (COZAAR) 25 MG tablet TAKE 1 TABLET (25 MG TOTAL) BY MOUTH DAILY. 90 tablet 3   Multiple Vitamin (MULTIVITAMIN) tablet Take 1 tablet by mouth daily.     testosterone cypionate (DEPOTESTOTERONE CYPIONATE) 100 MG/ML injection Inject 200 mg into the muscle every 14 (fourteen) days. For IM use only     valACYclovir (VALTREX) 1000 MG tablet Take 2 pills by mouth twice daily for 1 day for cold sore 20 tablet 1   predniSONE (DELTASONE) 20 MG tablet Take three tablets  by mouth for three days then two tablets for two days then one tablet for two days 15 tablet 0   No facility-administered medications prior to visit.     Per HPI unless specifically indicated in ROS section below Review of Systems  Constitutional:  Negative for fatigue and fever.  HENT:  Negative for ear pain.   Eyes:  Negative for pain.  Respiratory:  Negative for cough and shortness of breath.   Cardiovascular:  Negative for chest pain, palpitations and leg swelling.  Gastrointestinal:  Negative for abdominal pain.  Genitourinary:  Negative for dysuria.  Musculoskeletal:  Negative for arthralgias.  Neurological:  Negative for syncope, light-headedness and headaches.  Psychiatric/Behavioral:  Negative for dysphoric mood.    Objective:  BP 130/74   Pulse 88   Temp 99.4 F (37.4 C) (Oral)   Ht 5' 11.5" (1.816 m)   Wt 212 lb 4 oz (96.3 kg)   SpO2 98%   BMI 29.19 kg/m   Wt Readings from Last 3 Encounters:  11/22/22 212 lb 4 oz (96.3 kg)  07/26/22 211 lb 9.6 oz (96 kg)  06/28/22 214 lb 1.1 oz (97.1 kg)      Physical Exam Constitutional:      Appearance: He is well-developed.  HENT:  Head: Normocephalic.     Right Ear: Hearing normal.     Left Ear: Hearing normal.     Nose: Nose normal.  Neck:     Thyroid: No thyroid mass or thyromegaly.     Vascular: No carotid bruit.     Trachea: Trachea normal.  Cardiovascular:     Rate and Rhythm: Normal rate and regular rhythm.     Pulses: Normal pulses.     Heart sounds: Heart sounds not distant. No murmur heard.    No friction rub. No gallop.     Comments: No peripheral edema Pulmonary:     Effort: Pulmonary effort is normal. No respiratory distress.     Breath sounds: Normal breath sounds.  Musculoskeletal:     Right foot: Decreased range of motion. Swelling and tenderness present.     Left foot: Normal.     Comments: MTP headright great toe with redness, swelling and pain  Skin:    General: Skin is warm and dry.      Findings: No rash.  Psychiatric:        Speech: Speech normal.        Behavior: Behavior normal.        Thought Content: Thought content normal.       Results for orders placed or performed in visit on 08/31/22  Uric acid  Result Value Ref Range   Uric Acid, Serum 6.9 4.0 - 7.8 mg/dL     COVID 19 screen:  No recent travel or known exposure to COVID19 The patient denies respiratory symptoms of COVID 19 at this time. The importance of social distancing was discussed today.   Assessment and Plan    Problem List Items Addressed This Visit     Gout of big toe - Primary     Acute flare. Treat with DepoMedrol 80 mg IM x 1 followed by prednisone  40 mg daily until flare resolved.  Check uric acid.  For further flares recommended colchicine early in flare and not stopping allopurinol.   He  will continue low purine diet and restart allopurinol once flare in over.       Relevant Medications   predniSONE (DELTASONE) 20 MG tablet   colchicine 0.6 MG tablet   Other Relevant Orders   Uric acid   Meds ordered this encounter  Medications   predniSONE (DELTASONE) 20 MG tablet    Sig: Daily until flare resolved, then can wean off    Dispense:  30 tablet    Refill:  0   colchicine 0.6 MG tablet    Sig: 2 times daily early in  gout flare until resolved or diarrhea.    Dispense:  30 tablet    Refill:  0   methylPREDNISolone acetate (DEPO-MEDROL) injection 80 mg   Orders Placed This Encounter  Procedures   Uric acid     Kerby Nora, MD

## 2022-11-23 LAB — URIC ACID: Uric Acid, Serum: 7.5 mg/dL (ref 4.0–7.8)

## 2022-11-30 ENCOUNTER — Other Ambulatory Visit: Payer: Self-pay | Admitting: Family Medicine

## 2022-12-20 ENCOUNTER — Other Ambulatory Visit: Payer: Self-pay | Admitting: Family Medicine

## 2022-12-20 NOTE — Telephone Encounter (Signed)
Last office visit 11/22/22 for Gout.  Last refilled 11/22/22 for #30 with no refills.  No future appointments.

## 2022-12-22 ENCOUNTER — Other Ambulatory Visit: Payer: Self-pay | Admitting: Family Medicine

## 2023-03-17 ENCOUNTER — Other Ambulatory Visit: Payer: Self-pay | Admitting: Family Medicine

## 2023-03-17 NOTE — Telephone Encounter (Signed)
Last office visit 11/22/2022 for Gout of big toe.  Last refilled prednisone-11/22/2022 for #30 with no refills. Colchicine 12/12/2023f or #30 with no refills.  Next appt:  No future appointments.

## 2023-04-05 ENCOUNTER — Other Ambulatory Visit: Payer: Self-pay | Admitting: Family Medicine

## 2023-04-05 NOTE — Telephone Encounter (Signed)
Pharmacy is requesting 90 day supply.   Is 90 day appropriate for the medication?  Please advise.

## 2023-04-25 ENCOUNTER — Other Ambulatory Visit: Payer: Self-pay | Admitting: Family Medicine

## 2023-04-25 NOTE — Telephone Encounter (Signed)
Call How many flares has he had in the last 6 months?  It seems that he has had quite a few.  I would not recommend so many frequent courses of prednisone.   Once his current flare is over, given his uric acid was elevated at last check I would suggest increasing his allopurinol to a higher dose to prevent flares and decrease prednisone use.  Is he having a current flare ?

## 2023-04-25 NOTE — Telephone Encounter (Signed)
Last office visit 11/22/22 for Gout of big toe.  Last refilled 03/17/23 for #30 with no refills.  Next Appt: No future appointments.

## 2023-04-26 NOTE — Telephone Encounter (Signed)
Left message for [place] to return my call in regards to Prednisone refill request [facility] sent [place] yesterday.

## 2023-04-26 NOTE — Telephone Encounter (Signed)
Patient returned call,would like a call back 

## 2023-04-26 NOTE — Telephone Encounter (Signed)
Spoke with [place].  He did not request refill of the prednisone.  Pharmacy must have sent it as an automatic refill.   Refill denied.  FYI to Dr. Ermalene Searing.

## 2023-07-30 ENCOUNTER — Other Ambulatory Visit: Payer: Self-pay | Admitting: Family Medicine

## 2023-08-28 ENCOUNTER — Emergency Department (HOSPITAL_BASED_OUTPATIENT_CLINIC_OR_DEPARTMENT_OTHER)
Admission: EM | Admit: 2023-08-28 | Discharge: 2023-08-28 | Disposition: A | Discharge status: Home / Self Care | Payer: 59 | Attending: Emergency Medicine | Admitting: Emergency Medicine

## 2023-08-28 DIAGNOSIS — Z7982 Long term (current) use of aspirin: Secondary | ICD-10-CM | POA: Diagnosis not present

## 2023-08-28 DIAGNOSIS — Z79899 Other long term (current) drug therapy: Secondary | ICD-10-CM | POA: Diagnosis not present

## 2023-08-28 DIAGNOSIS — S0990XA Unspecified injury of head, initial encounter: Secondary | ICD-10-CM | POA: Diagnosis present

## 2023-08-28 DIAGNOSIS — W228XXA Striking against or struck by other objects, initial encounter: Secondary | ICD-10-CM | POA: Insufficient documentation

## 2023-08-28 DIAGNOSIS — Z87891 Personal history of nicotine dependence: Secondary | ICD-10-CM | POA: Diagnosis not present

## 2023-08-28 DIAGNOSIS — S0101XA Laceration without foreign body of scalp, initial encounter: Secondary | ICD-10-CM | POA: Insufficient documentation

## 2023-08-28 DIAGNOSIS — I10 Essential (primary) hypertension: Secondary | ICD-10-CM | POA: Diagnosis not present

## 2023-08-28 MED ORDER — LIDOCAINE-EPINEPHRINE (PF) 2 %-1:200000 IJ SOLN
10.0000 mL | Freq: Once | INTRAMUSCULAR | Status: AC
  Administered 2023-08-28: 10 mL
  Filled 2023-08-28: qty 20

## 2023-08-28 NOTE — Discharge Instructions (Signed)
As discussed, your laceration was repaired using absorbable sutures.  They should dissolve in 7 to 10 days.  Recommend washing area gently with warm soapy water but avoid submersion of head of bodies of water as well as hair products until area is healed.  Take Tylenol/Motrin for pain.  Please do not hesitate to return to the emergency department if the worrisome signs and symptoms we discussed become apparent.

## 2023-08-28 NOTE — ED Provider Notes (Signed)
EMERGENCY DEPARTMENT AT [facility] Provider Note   CSN: [id number] Arrival date & time: 08/28/23  1834     History  Chief Complaint  Patient presents with   Head Injury    Peter Fuller is a 42 y.o. male.   Head Injury   42 year old male presents emergency department with complaints of laceration to the top of the head.  Patient states that he stood up and hit the top of his head on the holder of a ladder.  Denies loss conscious, blood thinner use.  Denies any visual disturbance, gait abnormality, slurred speech, facial droop, weakness/sensory deficits upper extremities.  Patient with about a 2 inch laceration to the top of his head that he thinks needs stitches.  Denies any trauma elsewhere.  Reports tetanus up-to-date.  Past medical history significant for hypertension  Home Medications Prior to Admission medications   Medication Sig Start Date End Date Taking? Authorizing Provider  allopurinol (ZYLOPRIM) 100 MG tablet TAKE 1 TABLET BY MOUTH EVERY DAY 07/31/23   Bedsole, Amy E, MD  Aspirin 81 MG CAPS Take 1 capsule by mouth daily.    [provider]  colchicine 0.6 MG tablet TAKE ONE TABLET BY MOUTH 2 TIMES DAILY EARLY IN GOUT FLARE UNTIL RESOLVED OR DIARRHEA. 04/07/23   Ermalene Searing, Amy E, MD  fluticasone (FLONASE) 50 MCG/ACT nasal spray Place 2 sprays into both nostrils daily as needed for allergies or rhinitis.    [provider]  losartan (COZAAR) 25 MG tablet TAKE 1 TABLET (25 MG TOTAL) BY MOUTH DAILY. 08/07/22   Bedsole, Amy E, MD  Multiple Vitamin (MULTIVITAMIN) tablet Take 1 tablet by mouth daily.    [provider]  predniSONE (DELTASONE) 20 MG tablet TAKE AS DIRECTED DAILY UNTIL FLARE RESOLVED. THEN CAN WEAN OFF 03/17/23   Bedsole, Amy E, MD  testosterone cypionate (DEPOTESTOTERONE CYPIONATE) 100 MG/ML injection Inject 200 mg into the muscle every 14 (fourteen) days. For IM use only    [provider]  valACYclovir (VALTREX)  1000 MG tablet Take 2 pills by mouth twice daily for 1 day for cold sore 04/08/22   Tower, Audrie Gallus, MD      Allergies    Patient has no known allergies.    Review of Systems   Review of Systems  All other systems reviewed and are negative.   Physical Exam Updated Vital Signs BP (!) 150/110   Pulse 78   Temp 98.2 F (36.8 C) (Oral)   Resp 18   Ht 6' (1.829 m)   Wt 95.7 kg   SpO2 100%   BMI 28.62 kg/m  Physical Exam Vitals and nursing note reviewed.  Constitutional:      General: He is not in acute distress.    Appearance: He is well-developed.  HENT:     Head: Normocephalic.     Comments: Roughly 5 cm linear laceration noted to the crown of patient's head. Eyes:     Conjunctiva/sclera: Conjunctivae normal.  Cardiovascular:     Rate and Rhythm: Normal rate and regular rhythm.     Heart sounds: No murmur heard. Pulmonary:     Effort: Pulmonary effort is normal. No respiratory distress.     Breath sounds: Normal breath sounds.  Abdominal:     Palpations: Abdomen is soft.     Tenderness: There is no abdominal tenderness.  Musculoskeletal:        General: No swelling.     Cervical back: Neck supple.  Skin:  General: Skin is warm and dry.     Capillary Refill: Capillary refill takes less than 2 seconds.  Neurological:     Mental Status: He is alert.     Comments: Alert and oriented to self, place, time and event.   Speech is fluent, clear without dysarthria or dysphasia.   Strength 5/5 in upper/lower extremities   Sensation intact in upper/lower extremities   Normal gait.  CN I not tested  CN II not tested  CN III, IV, VI PERRLA and EOMs intact bilaterally  CN V Intact sensation to sharp and light touch to the face  CN VII facial movements symmetric  CN VIII not tested  CN IX, X no uvula deviation, symmetric rise of soft palate  CN XI 5/5 SCM and trapezius strength bilaterally  CN XII Midline tongue protrusion, symmetric L/R movements     Psychiatric:         Mood and Affect: Mood normal.     ED Results / Procedures / Treatments   Labs (all labs ordered are listed, but only abnormal results are displayed) Labs Reviewed - No data to display  EKG None  Radiology No results found.  Procedures .Marland KitchenLaceration Repair  Date/Time: 08/28/2023 10:12 PM  Performed by: Peter Garter, PA Authorized by: Peter Garter, PA   Consent:    Consent obtained:  Verbal   Consent given by:  Patient   Risks, benefits, and alternatives were discussed: yes     Risks discussed:  Need for additional repair, infection, nerve damage, poor cosmetic result, poor wound healing, pain, tendon damage, retained foreign body and vascular damage   Alternatives discussed:  No treatment, delayed treatment, observation and referral Universal protocol:    Procedure explained and questions answered to patient or proxy's satisfaction: yes     Patient identity confirmed:  Verbally with patient and arm band Anesthesia:    Anesthesia method:  Local infiltration   Local anesthetic:  Lidocaine 2% WITH epi Laceration details:    Location:  Scalp   Scalp location:  Crown   Length (cm):  5 Pre-procedure details:    Preparation:  Patient was prepped and draped in usual sterile fashion Exploration:    Limited defect created (wound extended): no     Hemostasis achieved with:  Direct pressure   Imaging outcome: foreign body not noted     Wound exploration: wound explored through full range of motion and entire depth of wound visualized     Contaminated: no   Treatment:    Area cleansed with:  Saline and povidone-iodine   Amount of cleaning:  Standard   Irrigation solution:  Sterile water   Irrigation volume:  250cc   Irrigation method:  Syringe   Visualized foreign bodies/material removed: no     Debridement:  None   Undermining:  None   Scar revision: no   Skin repair:    Repair method:  Sutures   Suture size:  4-0   Suture material:  Fast-absorbing gut    Suture technique:  Simple interrupted   Number of sutures:  7 Approximation:    Approximation:  Close Repair type:    Repair type:  Simple Post-procedure details:    Dressing:  Open (no dressing)   Procedure completion:  Tolerated well, no immediate complications     Medications Ordered in ED Medications  lidocaine-EPINEPHrine (XYLOCAINE W/EPI) 2 %-1:200000 (PF) injection 10 mL (has no administration in time range)    ED Course/ Medical Decision Making/  A&P                                 Medical Decision Making Risk Prescription drug management.   This patient presents to the ED for concern of laceration with, this involves an extensive number of treatment options, and is a complaint that carries with it a high risk of complications and morbidity.  The differential diagnosis includes.  Laceration   Co morbidities that complicate the patient evaluation  See HPI   Additional history obtained:  Additional history obtained from EMR External records from outside source obtained and reviewed including hospital records   Lab Tests:  N/a   Imaging Studies ordered:  N/a   Cardiac Monitoring: / EKG:  The patient was maintained on a cardiac monitor.  I personally viewed and interpreted the cardiac monitored which showed an underlying rhythm of: Sinus rhythm   Consultations Obtained:  N/a   Problem List / ED Course / Critical interventions / Medication management  Laceration I ordered medication including lidocaine with epinephrine   Reevaluation of the patient after these medicines showed that the patient improved I have reviewed the patients home medicines and have made adjustments as needed   Social Determinants of Health:  Former cigarette use.  Denies illicit drug use.   Test / Admission - Considered:  Laceration Vitals signs significant for hypertension blood pressure 150 systolic. Otherwise within normal range and stable throughout  visit. 42 year old male presents emergency department with complaints of laceration.  Patient with trauma to head but without loss of consciousness, nausea, vomiting, neurologic deficit.  Per [place] CT head, CT imaging not indicated.  Laceration linear without evidence of contamination.  Repaired in manner as depicted above.  Patient educated regarding proper wound care at home.  Will recommend symptomatic therapy as well as wound care as described in AVS.  Treatment plan discussed at length with patient and he acknowledged understanding was agreeable to said plan.  Patient overall well-appearing, afebrile in no acute distress. Worrisome signs and symptoms were discussed with the patient, and the patient acknowledged understanding to return to the ED if noticed. Patient was stable upon discharge.          Final Clinical Impression(s) / ED Diagnoses Final diagnoses:  Laceration of scalp without foreign body, initial encounter    Rx / DC Orders ED Discharge Orders     None         Peter Garter, [place] 08/28/23 2214    Melene Plan, DO 08/28/23 2227

## 2023-08-28 NOTE — ED Notes (Signed)
Pt provided discharge instructions and prescription information. Pt was given the opportunity to ask questions and questions were answered.   

## 2023-08-28 NOTE — ED Triage Notes (Signed)
Report striking head on ladder holder of firetruck. ~1.5" laceration to top right skull. Bleeding controlled on arrival with no pressure. Tetanus up to date. No LOC. No neck or back pain.

## 2023-09-01 ENCOUNTER — Other Ambulatory Visit: Payer: Self-pay | Admitting: Family Medicine

## 2024-01-05 ENCOUNTER — Ambulatory Visit: Payer: 59 | Admitting: Family Medicine

## 2024-01-05 ENCOUNTER — Encounter: Payer: Self-pay | Admitting: Family Medicine

## 2024-01-05 VITALS — BP 124/80 | HR 100 | Temp 99.9°F | Ht 71.5 in | Wt 216.4 lb

## 2024-01-05 DIAGNOSIS — J101 Influenza due to other identified influenza virus with other respiratory manifestations: Secondary | ICD-10-CM | POA: Insufficient documentation

## 2024-01-05 DIAGNOSIS — R509 Fever, unspecified: Secondary | ICD-10-CM | POA: Diagnosis not present

## 2024-01-05 LAB — POC INFLUENZA A&B (BINAX/QUICKVUE)
Influenza A, POC: POSITIVE — AB
Influenza B, POC: NEGATIVE

## 2024-01-05 LAB — POC COVID19 BINAXNOW: SARS Coronavirus 2 Ag: NEGATIVE

## 2024-01-05 MED ORDER — OSELTAMIVIR PHOSPHATE 75 MG PO CAPS: 75.0000 mg | ORAL_CAPSULE | Freq: Two times a day (BID) | ORAL | 0 refills | Status: DC

## 2024-01-05 NOTE — Assessment & Plan Note (Signed)
Acute, recommend symptomatic and supportive care.  Patient without significant risk factors such as asthma or COPD.  He is interested in using Tamiflu to decrease contagiousness given he has a 1-month-old in his household.  We will treat with Tamiflu 75 mg p.o. twice daily x 5 days.  He can use Mucinex DM twice daily as needed for cough.  Can use Tylenol as needed for fever. We discussed remaining out of work until 24 hours after fever resolved off antipyretics. We also discussed avoidance of close contact with his child.  His wife is a patient at our office and I will prescribe Tamiflu for prophylaxis.

## 2024-01-05 NOTE — Progress Notes (Signed)
Patient ID: Peter Fuller, male    DOB: 17-Dec-1980, 43 y.o.   MRN: [id number]  This visit was conducted in person.  BP 124/80 (BP Location: Left Arm, Patient Position: Sitting, Cuff Size: Large)   Pulse 100   Temp 99.9 F (37.7 C) (Oral)   Ht 5' 11.5" (1.816 m)   Wt 216 lb 6 oz (98.1 kg)   SpO2 96%   BMI 29.76 kg/m    CC:  Chief Complaint  Patient presents with   Fever    X 3 day   Cough    Dry Cough   Nasal Congestion    Runny Nose    Subjective:   HPI: Peter Fuller is a 43 y.o. male presenting on 01/05/2024 for Fever (X 3 day), Cough (Dry Cough), and Nasal Congestion (Runny Nose)   Date of onset:  3 days Initial symptoms included  fever ( highest 101.1 F. Symptoms progressed to   dry cough, runny nose, congestion.   Mild headache and fatigue  No body aches.    Sick contacts:  none COVID testing:   none     He has tried to treat with  tylenol, ibuprofen, Nyquil. Cough not keeping him up at night.     No history of chronic lung disease such as asthma or COPD.  Former smoker.   Drinking water and Gatorade.     Relevant past medical, surgical, family and social history reviewed and updated as indicated. Interim medical history since our last visit reviewed. Allergies and medications reviewed and updated. Outpatient Medications Prior to Visit  Medication Sig Dispense Refill   allopurinol (ZYLOPRIM) 100 MG tablet TAKE 1 TABLET BY MOUTH EVERY DAY 30 tablet 5   Aspirin 81 MG CAPS Take 1 capsule by mouth daily.     colchicine 0.6 MG tablet TAKE ONE TABLET BY MOUTH 2 TIMES DAILY EARLY IN GOUT FLARE UNTIL RESOLVED OR DIARRHEA. 180 tablet 0   fluticasone (FLONASE) 50 MCG/ACT nasal spray Place 2 sprays into both nostrils daily as needed for allergies or rhinitis.     losartan (COZAAR) 25 MG tablet TAKE 1 TABLET (25 MG TOTAL) BY MOUTH DAILY. 30 tablet 5   Multiple Vitamin (MULTIVITAMIN) tablet Take 1 tablet by mouth daily.     predniSONE (DELTASONE) 20 MG tablet TAKE AS  DIRECTED DAILY UNTIL FLARE RESOLVED. THEN CAN WEAN OFF 30 tablet 0   testosterone cypionate (DEPOTESTOTERONE CYPIONATE) 100 MG/ML injection Inject 200 mg into the muscle every 14 (fourteen) days. For IM use only     valACYclovir (VALTREX) 1000 MG tablet Take 2 pills by mouth twice daily for 1 day for cold sore 20 tablet 1   No facility-administered medications prior to visit.     Per HPI unless specifically indicated in ROS section below Review of Systems  Constitutional:  Positive for fatigue and fever.  HENT:  Positive for congestion. Negative for ear pain.   Eyes:  Negative for pain.  Respiratory:  Positive for cough. Negative for shortness of breath.   Cardiovascular:  Negative for chest pain, palpitations and leg swelling.  Gastrointestinal:  Negative for abdominal pain.  Genitourinary:  Negative for dysuria.  Musculoskeletal:  Negative for arthralgias.  Neurological:  Negative for syncope, light-headedness and headaches.  Psychiatric/Behavioral:  Negative for dysphoric mood.    Objective:  BP 124/80 (BP Location: Left Arm, Patient Position: Sitting, Cuff Size: Large)   Pulse 100   Temp 99.9 F (37.7 C) (Oral)   Ht 5' 11.5" (  1.816 m)   Wt 216 lb 6 oz (98.1 kg)   SpO2 96%   BMI 29.76 kg/m   Wt Readings from Last 3 Encounters:  01/05/24 216 lb 6 oz (98.1 kg)  08/28/23 211 lb (95.7 kg)  11/22/22 212 lb 4 oz (96.3 kg)      Physical Exam Vitals reviewed.  Constitutional:      Appearance: He is well-developed.  HENT:     Head: Normocephalic.     Right Ear: Hearing normal.     Left Ear: Hearing normal.     Nose: Nose normal.  Neck:     Thyroid: No thyroid mass or thyromegaly.     Vascular: No carotid bruit.     Trachea: Trachea normal.  Cardiovascular:     Rate and Rhythm: Normal rate and regular rhythm.     Pulses: Normal pulses.     Heart sounds: Heart sounds not distant. No murmur heard.    No friction rub. No gallop.     Comments: No peripheral  edema Pulmonary:     Effort: Pulmonary effort is normal. No respiratory distress.     Breath sounds: Normal breath sounds.  Skin:    General: Skin is warm and dry.     Findings: No rash.  Psychiatric:        Speech: Speech normal.        Behavior: Behavior normal.        Thought Content: Thought content normal.       Results for orders placed or performed in visit on 01/05/24  POC Influenza A&B (Binax test)   Collection Time: 01/05/24  4:09 PM  Result Value Ref Range   Influenza A, POC Positive (A) Negative   Influenza B, POC Negative Negative  POC COVID-19   Collection Time: 01/05/24  4:10 PM  Result Value Ref Range   SARS Coronavirus 2 Ag Negative Negative    Assessment and Plan  Influenza A Assessment & Plan: Acute, recommend symptomatic and supportive care.  Patient without significant risk factors such as asthma or COPD.  He is interested in using Tamiflu to decrease contagiousness given he has a 74-month-old in his household.  We will treat with Tamiflu 75 mg p.o. twice daily x 5 days.  He can use Mucinex DM twice daily as needed for cough.  Can use Tylenol as needed for fever. We discussed remaining out of work until 24 hours after fever resolved off antipyretics. We also discussed avoidance of close contact with his child.  His wife is a patient at our office and I will prescribe Tamiflu for prophylaxis.   Fever, unspecified fever cause -     POC Influenza A&B(BINAX/QUICKVUE) -     POC COVID-19 BinaxNow  Other orders -     Oseltamivir Phosphate; Take 1 capsule (75 mg total) by mouth 2 (two) times daily.  Dispense: 10 capsule; Refill: 0    No follow-ups on file.   Kerby Nora, MD

## 2024-01-16 ENCOUNTER — Telehealth: Payer: Self-pay | Admitting: *Deleted

## 2024-01-16 DIAGNOSIS — I10 Essential (primary) hypertension: Secondary | ICD-10-CM

## 2024-01-16 DIAGNOSIS — M1A0720 Idiopathic chronic gout, left ankle and foot, without tophus (tophi): Secondary | ICD-10-CM

## 2024-01-16 NOTE — Telephone Encounter (Signed)
-----   Message from Lovena Neighbours sent at 01/16/2024  1:25 PM EST ----- Regarding: Labs for Wednesday 2.5.25 Please put physical lab orders in future. Thank you, Denny Peon

## 2024-01-17 ENCOUNTER — Encounter: Payer: Self-pay | Admitting: Family Medicine

## 2024-01-17 ENCOUNTER — Ambulatory Visit (INDEPENDENT_AMBULATORY_CARE_PROVIDER_SITE_OTHER): Payer: 59 | Admitting: Family Medicine

## 2024-01-17 ENCOUNTER — Other Ambulatory Visit: Payer: 59

## 2024-01-17 VITALS — BP 120/76 | HR 68 | Temp 98.6°F | Ht 71.5 in | Wt 214.0 lb

## 2024-01-17 DIAGNOSIS — M1A0720 Idiopathic chronic gout, left ankle and foot, without tophus (tophi): Secondary | ICD-10-CM | POA: Diagnosis not present

## 2024-01-17 DIAGNOSIS — M109 Gout, unspecified: Secondary | ICD-10-CM | POA: Diagnosis not present

## 2024-01-17 DIAGNOSIS — I10 Essential (primary) hypertension: Secondary | ICD-10-CM

## 2024-01-17 DIAGNOSIS — Z0000 Encounter for general adult medical examination without abnormal findings: Secondary | ICD-10-CM

## 2024-01-17 LAB — COMPREHENSIVE METABOLIC PANEL
ALT: 39 U/L (ref 0–53)
AST: 24 U/L (ref 0–37)
Albumin: 4.7 g/dL (ref 3.5–5.2)
Alkaline Phosphatase: 56 U/L (ref 39–117)
BUN: 13 mg/dL (ref 6–23)
CO2: 30 meq/L (ref 19–32)
Calcium: 9.2 mg/dL (ref 8.4–10.5)
Chloride: 103 meq/L (ref 96–112)
Creatinine, Ser: 0.95 mg/dL (ref 0.40–1.50)
GFR: 97.26 mL/min (ref >60.00)
Glucose, Bld: 92 mg/dL (ref 70–99)
Potassium: 3.8 meq/L (ref 3.5–5.1)
Sodium: 141 meq/L (ref 135–145)
Total Bilirubin: 0.7 mg/dL (ref 0.2–1.2)
Total Protein: 6.7 g/dL (ref 6.0–8.3)

## 2024-01-17 LAB — LIPID PANEL
Cholesterol: 191 mg/dL (ref 0–200)
HDL: 35.3 mg/dL — ABNORMAL LOW (ref >39.00)
LDL Cholesterol: 108 mg/dL — ABNORMAL HIGH (ref 0–99)
NonHDL: 155.76
Total CHOL/HDL Ratio: 5
Triglycerides: 240 mg/dL — ABNORMAL HIGH (ref 0.0–149.0)
VLDL: 48 mg/dL — ABNORMAL HIGH (ref 0.0–40.0)

## 2024-01-17 LAB — URIC ACID: Uric Acid, Serum: 6.6 mg/dL (ref 4.0–7.8)

## 2024-01-17 NOTE — Patient Instructions (Addendum)
 Please stop at the lab to have labs drawn.  Consider seeing Dr. Geralyn Knee sports med for plantar fasciitis.

## 2024-01-17 NOTE — Assessment & Plan Note (Signed)
Stable, chronic.  Continue current medication.   Losartan 25 mg p.o. daily

## 2024-01-17 NOTE — Assessment & Plan Note (Signed)
 Chronic, no flares in last year    He  will continue low purine diet. Allopurinol  100 mg daily

## 2024-01-17 NOTE — Progress Notes (Signed)
 Patient ID: Peter Fuller, male    DOB: Nov 24, 1981, 43 y.o.   MRN: [id number]  This visit was conducted in person.  BP 120/76   Pulse 68   Temp 98.6 F (37 C) (Oral)   Ht 5' 11.5 (1.816 m)   Wt 214 lb (97.1 kg)   SpO2 96%   BMI 29.43 kg/m    CC:  Chief Complaint  Patient presents with   Annual Exam    Subjective:   HPI: Peter Fuller is a 43 y.o. male presenting on 01/17/2024 for Annual Exam Recent influenza A diagnosed January 24.  Patient reports significant improvement in symptoms... still some congestion at night. Some fatigue.   Currently having issues with plantar fasciitis in  right heel... doing home stretches... tight in AM.  Left toe...  less flare since cutting out ETOH and pork.  Hypertension:   At goal on losartan  25 mg daily... Causes some cough BP Readings from Last 3 Encounters:  01/17/24 120/76  01/05/24 124/80  08/28/23 (!) 155/97  Using medication without problems or lightheadedness:  none Chest pain with exertion: none Edema: none Short of breath: none Average home BPs: 120/70s-138/94... has been better with beet juice Other issues:  Wt Readings from Last 3 Encounters:  01/17/24 214 lb (97.1 kg)  01/05/24 216 lb 6 oz (98.1 kg)  08/28/23 211 lb (95.7 kg)  Body mass index is 29.43 kg/m.    Gout  No recent flares. Using colchicine  prn. On allopirinol 100 mg daily.  He has made diet  change.. no beer, less salmon, less red meats. Lab Results  Component Value Date   LABURIC 7.5 11/22/2022   Off testosterone .  Due for cholesterol and DM screening labs.   Working out 2-3 days a week.     Relevant past medical, surgical, family and social history reviewed and updated as indicated. Interim medical history since our last visit reviewed. Allergies and medications reviewed and updated. Outpatient Medications Prior to Visit  Medication Sig Dispense Refill   allopurinol  (ZYLOPRIM ) 100 MG tablet TAKE 1 TABLET BY MOUTH EVERY DAY 30 tablet 5   Aspirin  81 MG CAPS Take 1 capsule by mouth daily.     colchicine  0.6 MG tablet TAKE ONE TABLET BY MOUTH 2 TIMES DAILY EARLY IN GOUT FLARE UNTIL RESOLVED OR DIARRHEA. 180 tablet 0   fluticasone  (FLONASE ) 50 MCG/ACT nasal spray Place 2 sprays into both nostrils daily as needed for allergies or rhinitis.     losartan  (COZAAR ) 25 MG tablet TAKE 1 TABLET (25 MG TOTAL) BY MOUTH DAILY. 30 tablet 5   Multiple Vitamin (MULTIVITAMIN) tablet Take 1 tablet by mouth daily.     valACYclovir  (VALTREX ) 1000 MG tablet Take 2 pills by mouth twice daily for 1 day for cold sore 20 tablet 1   testosterone  cypionate (DEPOTESTOTERONE CYPIONATE) 100 MG/ML injection Inject 200 mg into the muscle every 14 (fourteen) days. For IM use only (Patient not taking: Reported on 01/17/2024)     oseltamivir  (TAMIFLU ) 75 MG capsule Take 1 capsule (75 mg total) by mouth 2 (two) times daily. 10 capsule 0   predniSONE  (DELTASONE ) 20 MG tablet TAKE AS DIRECTED DAILY UNTIL FLARE RESOLVED. THEN CAN WEAN OFF 30 tablet 0   No facility-administered medications prior to visit.     Per HPI unless specifically indicated in ROS section below Review of Systems  Constitutional:  Negative for fatigue and fever.  HENT:  Negative for ear pain.   Eyes:  Negative  for pain.  Respiratory:  Negative for cough and shortness of breath.   Cardiovascular:  Negative for chest pain, palpitations and leg swelling.  Gastrointestinal:  Negative for abdominal pain.  Genitourinary:  Negative for dysuria.  Musculoskeletal:  Negative for arthralgias.  Neurological:  Negative for syncope, light-headedness and headaches.  Psychiatric/Behavioral:  Negative for dysphoric mood.    Objective:  BP 120/76   Pulse 68   Temp 98.6 F (37 C) (Oral)   Ht 5' 11.5 (1.816 m)   Wt 214 lb (97.1 kg)   SpO2 96%   BMI 29.43 kg/m   Wt Readings from Last 3 Encounters:  01/17/24 214 lb (97.1 kg)  01/05/24 216 lb 6 oz (98.1 kg)  08/28/23 211 lb (95.7 kg)      Physical  Exam Constitutional:      General: He is not in acute distress.    Appearance: Normal appearance. He is well-developed. He is not ill-appearing or toxic-appearing.  HENT:     Head: Normocephalic and atraumatic.     Right Ear: Hearing, tympanic membrane, ear canal and external ear normal.     Left Ear: Hearing, tympanic membrane, ear canal and external ear normal.     Nose: Nose normal.     Mouth/Throat:     Pharynx: Uvula midline.  Eyes:     General: Lids are normal. Lids are everted, no foreign bodies appreciated.     Conjunctiva/sclera: Conjunctivae normal.     Pupils: Pupils are equal, round, and reactive to light.  Neck:     Thyroid: No thyroid mass or thyromegaly.     Vascular: No carotid bruit.     Trachea: Trachea and phonation normal.  Cardiovascular:     Rate and Rhythm: Normal rate and regular rhythm.     Pulses: Normal pulses.     Heart sounds: S1 normal and S2 normal. Heart sounds not distant. No murmur heard.    No friction rub. No gallop.     Comments: No peripheral edema Pulmonary:     Effort: Pulmonary effort is normal. No respiratory distress.     Breath sounds: Normal breath sounds. No wheezing, rhonchi or rales.  Abdominal:     General: Bowel sounds are normal.     Palpations: Abdomen is soft.     Tenderness: There is no abdominal tenderness. There is no guarding or rebound.     Hernia: No hernia is present.  Musculoskeletal:     Cervical back: Normal range of motion and neck supple.  Lymphadenopathy:     Cervical: No cervical adenopathy.  Skin:    General: Skin is warm and dry.     Findings: No rash.  Neurological:     Mental Status: He is alert.     Cranial Nerves: No cranial nerve deficit.     Sensory: No sensory deficit.     Gait: Gait normal.     Deep Tendon Reflexes: Reflexes are normal and symmetric.  Psychiatric:        Speech: Speech normal.        Behavior: Behavior normal.        Thought Content: Thought content normal.        Judgment:  Judgment normal.       Results for orders placed or performed in visit on 01/05/24  POC Influenza A&B (Binax test)   Collection Time: 01/05/24  4:09 PM  Result Value Ref Range   Influenza A, POC Positive (A) Negative   Influenza B, POC  Negative Negative  POC COVID-19   Collection Time: 01/05/24  4:10 PM  Result Value Ref Range   SARS Coronavirus 2 Ag Negative Negative     COVID 19 screen:  No recent travel or known exposure to COVID19 The patient denies respiratory symptoms of COVID 19 at this time. The importance of social distancing was discussed today.   Assessment and Plan   The patient's preventative maintenance and recommended screening tests for an annual wellness exam were reviewed in full today. Brought up to date unless services declined.  Counselled on the importance of diet, exercise, and its role in overall health and mortality. The patient's FH and SH was reviewed, including their home life, tobacco status, and drug and alcohol status.   Vaccines:  Tdap 2016, uptodate with flu vaccine 09/2023 STD testing: refused. Remote smoker: 2 ppd QUIT 2005.  No early family history of prostate cancer or  PGM with colon cancer. PSA negative at work. Hep C :  done  Negative in past after exposure. Problem List Items Addressed This Visit     Benign essential hypertension   Stable, chronic.  Continue current medication.   Losartan  25 mg p.o. daily      Gout of big toe     Chronic, no flares in last year    He  will continue low purine diet. 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    Greig Ring, MD

## 2024-01-18 ENCOUNTER — Encounter: Payer: Self-pay | Admitting: Family Medicine

## 2024-01-21 NOTE — Progress Notes (Signed)
Peter Javier T. Peter Belger, MD, CAQ Sports Medicine [facility] [address] [place], [address]  Phone: [phone]  FAX: [phone]  Peter Fuller - 43 y.o. male  MRN [id number]  Date of Birth: 1981-05-22  Date: 01/22/2024  PCP: Excell Seltzer, MD  Referral: Excell Seltzer, MD  Chief Complaint  Patient presents with   Plantar Fasciitis    Right Foot   Subjective:   This 43 y.o. male patient presents with a 6 mo long history of R heel pain. This is notable for worsening pain first thing in the morning when arising and standing after sitting.   Prior foot or ankle fractures: none Prior operations: none Orthotics or bracing: OTC orthotics Medications: Motrin PT or home rehab: Yes Night splints: no Ice massage: no Ball massage: Yes  Metatarsal pain: no   Review of Systems is noted in the HPI, as appropriate  Objective:   Blood pressure 112/80, pulse 67, temperature 97.8 F (36.6 C), temperature source Temporal, height 5' 11.5" (1.816 m), weight 216 lb 6 oz (98.1 kg), SpO2 98%.  GEN: No acute distress; alert,appropriate. PULM: Breathing comfortably in no respiratory distress PSYCH: Normally interactive.   Foot: R Echymosis: no Edema: no ROM: full LE B Gait: heel toe, non-antalgic MT pain: no Callus pattern: none Lateral Mall: NT Medial Mall: NT Talus: NT Navicular: NT Calcaneous: NT Metatarsals: NT 5th MT: NT Phalanges: NT Achilles: NT Plantar Fascia: tender, medial along PF. Pain with forced dorsi Fat Pad: NT Peroneals: NT Post Tib: NT Great Toe: Nml motion Ant Drawer: neg Other foot breakdown: none Long arch: preserved Transverse arch: preserved Hindfoot breakdown: none Sensation: intact  Assessment and Plan:     ICD-10-CM   1. Plantar fasciitis, right  M72.2      He has been having plantar fasciitis for the last 6 months, he does think it is slowly been getting better right now it is not hurting that meds  today.  Anatomy reviewed. Stretching and rehab are critically important to the treatment of PF. Reviewed footwear. Rigid soles have been shown to help with PF.  Reviewed rehab of stretching and calf raises.  Reviewed rehab from [employer]  Could consider a corticosteroid injection if conservative treatment fails, but right now since he is feeling somewhat better today, I would probably hold off.  Patient Instructions  Please read handouts on Plantar Fascitis.  STRETCHING and Strengthening program critically important.  Strengthening on foot and calf muscles as seen in handout. Calf raises, 2 legged, then 1 legged. Foot massage with tennis ball. Ice massage.  Towel Scrunches: get a towel or hand towel, use toes to pick up and scrunch up the towel.  Marble pick-ups, practice picking up marbles with toes and placing into a cup  NEEDS TO BE DONE EVERY DAY  Recommended over the counter insoles. (Spenco or Hapad)  A rigid shoe with good arch support helps: Dansko (great), Randel Pigg, Merrell No easily bendable shoes.   Tuli's heel cups    Follow-up: prn  No orders of the defined types were placed in this encounter.  No orders of the defined types were placed in this encounter.   Signed,  Elpidio Galea. Svea Pusch, MD   Patient's Medications  New Prescriptions   No medications on file  Previous Medications   ALLOPURINOL (ZYLOPRIM) 100 MG TABLET    TAKE 1 TABLET BY MOUTH EVERY DAY   ASPIRIN 81 MG CAPS  Take 1 capsule by mouth daily.   COLCHICINE 0.6 MG TABLET    TAKE ONE TABLET BY MOUTH 2 TIMES DAILY EARLY IN GOUT FLARE UNTIL RESOLVED OR DIARRHEA.   FLUTICASONE (FLONASE) 50 MCG/ACT NASAL SPRAY    Place 2 sprays into both nostrils daily as needed for allergies or rhinitis.   LOSARTAN (COZAAR) 25 MG TABLET    TAKE 1 TABLET (25 MG TOTAL) BY MOUTH DAILY.   MULTIPLE VITAMIN (MULTIVITAMIN) TABLET    Take 1 tablet by mouth daily.   VALACYCLOVIR (VALTREX)  1000 MG TABLET    Take 2 pills by mouth twice daily for 1 day for cold sore  Modified Medications   No medications on file  Discontinued Medications   TESTOSTERONE CYPIONATE (DEPOTESTOTERONE CYPIONATE) 100 MG/ML INJECTION    Inject 200 mg into the muscle every 14 (fourteen) days. For IM use only

## 2024-01-22 ENCOUNTER — Encounter: Payer: Self-pay | Admitting: Family Medicine

## 2024-01-22 ENCOUNTER — Ambulatory Visit: Payer: 59 | Admitting: Family Medicine

## 2024-01-22 VITALS — BP 112/80 | HR 67 | Temp 97.8°F | Ht 71.5 in | Wt 216.4 lb

## 2024-01-22 DIAGNOSIS — M722 Plantar fascial fibromatosis: Secondary | ICD-10-CM

## 2024-01-22 NOTE — Patient Instructions (Signed)
Please read handouts on Plantar Fascitis.  STRETCHING and Strengthening program critically important.  Strengthening on foot and calf muscles as seen in handout. Calf raises, 2 legged, then 1 legged. Foot massage with tennis ball. Ice massage.  Towel Scrunches: get a towel or hand towel, use toes to pick up and scrunch up the towel.  Marble pick-ups, practice picking up marbles with toes and placing into a cup  NEEDS TO BE DONE EVERY DAY  Recommended over the counter insoles. (Spenco or Hapad)  A rigid shoe with good arch support helps: Dansko (great), Keen, Merrell No easily bendable shoes.   Tuli's heel cups  

## 2024-01-31 ENCOUNTER — Other Ambulatory Visit: Payer: Self-pay | Admitting: Family Medicine

## 2024-03-11 ENCOUNTER — Other Ambulatory Visit: Payer: Self-pay | Admitting: Family Medicine

## 2024-07-17 ENCOUNTER — Other Ambulatory Visit (HOSPITAL_BASED_OUTPATIENT_CLINIC_OR_DEPARTMENT_OTHER): Payer: Self-pay | Admitting: Family Medicine

## 2024-07-17 DIAGNOSIS — Z8249 Family history of ischemic heart disease and other diseases of the circulatory system: Secondary | ICD-10-CM

## 2024-08-19 ENCOUNTER — Ambulatory Visit (HOSPITAL_BASED_OUTPATIENT_CLINIC_OR_DEPARTMENT_OTHER)
Admission: RE | Admit: 2024-08-19 | Discharge: 2024-08-19 | Disposition: A | Discharge status: Home / Self Care | Payer: Self-pay | Source: Ambulatory Visit | Attending: Family Medicine | Admitting: Family Medicine

## 2024-08-19 DIAGNOSIS — Z8249 Family history of ischemic heart disease and other diseases of the circulatory system: Secondary | ICD-10-CM

## 2024-09-12 ENCOUNTER — Telehealth: Payer: Self-pay

## 2024-09-12 ENCOUNTER — Other Ambulatory Visit: Payer: Self-pay | Admitting: Family Medicine

## 2024-09-12 DIAGNOSIS — E781 Pure hyperglyceridemia: Secondary | ICD-10-CM

## 2024-09-12 NOTE — Telephone Encounter (Signed)
 Opened in error

## 2024-09-12 NOTE — Telephone Encounter (Signed)
 Spoke with Peter Fuller.  He states he needs to have his cholesterol recheck because when he had labs done for [employer], he triglyerides were really high and he was told to recheck labs through his PCP.   Lab appointment scheduled 09/13/2024 at 9:00 am.  Future lab orders in Epic. FYI to Dr. Avelina.

## 2024-09-12 NOTE — Telephone Encounter (Signed)
 Copied from CRM [id number]. Topic: Clinical - Request for Lab/Test Order >> Sep 12, 2024  1:58 PM Pinkey ORN wrote: Reason for CRM: Requesting Labs

## 2024-09-13 ENCOUNTER — Other Ambulatory Visit (INDEPENDENT_AMBULATORY_CARE_PROVIDER_SITE_OTHER)

## 2024-09-13 DIAGNOSIS — E781 Pure hyperglyceridemia: Secondary | ICD-10-CM

## 2024-09-13 LAB — LIPID PANEL
Cholesterol: 206 mg/dL — ABNORMAL HIGH (ref 0–200)
HDL: 38 mg/dL — ABNORMAL LOW (ref >39.00)
LDL Cholesterol: 129 mg/dL — ABNORMAL HIGH (ref 0–99)
NonHDL: 167.94
Total CHOL/HDL Ratio: 5
Triglycerides: 194 mg/dL — ABNORMAL HIGH (ref 0.0–149.0)
VLDL: 38.8 mg/dL (ref 0.0–40.0)

## 2024-09-13 LAB — COMPREHENSIVE METABOLIC PANEL WITH GFR
ALT: 46 U/L (ref 0–53)
AST: 27 U/L (ref 0–37)
Albumin: 4.7 g/dL (ref 3.5–5.2)
Alkaline Phosphatase: 53 U/L (ref 39–117)
BUN: 13 mg/dL (ref 6–23)
CO2: 31 meq/L (ref 19–32)
Calcium: 9.4 mg/dL (ref 8.4–10.5)
Chloride: 102 meq/L (ref 96–112)
Creatinine, Ser: 1 mg/dL (ref 0.40–1.50)
GFR: 91.03 mL/min (ref >60.00)
Glucose, Bld: 92 mg/dL (ref 70–99)
Potassium: 3.9 meq/L (ref 3.5–5.1)
Sodium: 140 meq/L (ref 135–145)
Total Bilirubin: 0.7 mg/dL (ref 0.2–1.2)
Total Protein: 6.8 g/dL (ref 6.0–8.3)

## 2024-09-18 ENCOUNTER — Ambulatory Visit: Payer: Self-pay | Admitting: Family Medicine

## 2024-10-28 ENCOUNTER — Other Ambulatory Visit: Payer: Self-pay | Admitting: Family Medicine
